# Patient Record
Sex: Female | Born: 1990
Health system: Southern US, Community
[De-identification: ages and names within clinical notes are randomized; demographics above are authoritative.]

## PROBLEM LIST (undated history)

## (undated) DIAGNOSIS — O09299 Supervision of pregnancy with other poor reproductive or obstetric history, unspecified trimester: Secondary | ICD-10-CM

## (undated) DIAGNOSIS — L309 Dermatitis, unspecified: Secondary | ICD-10-CM

## (undated) DIAGNOSIS — R519 Headache, unspecified: Secondary | ICD-10-CM

## (undated) DIAGNOSIS — J302 Other seasonal allergic rhinitis: Secondary | ICD-10-CM

## (undated) DIAGNOSIS — T783XXA Angioneurotic edema, initial encounter: Secondary | ICD-10-CM

## (undated) DIAGNOSIS — O1490 Unspecified pre-eclampsia, unspecified trimester: Secondary | ICD-10-CM

## (undated) DIAGNOSIS — J45909 Unspecified asthma, uncomplicated: Secondary | ICD-10-CM

## (undated) DIAGNOSIS — R51 Headache: Secondary | ICD-10-CM

## (undated) DIAGNOSIS — E785 Hyperlipidemia, unspecified: Secondary | ICD-10-CM

## (undated) HISTORY — DX: Supervision of pregnancy with other poor reproductive or obstetric history, unspecified trimester: O09.299

## (undated) HISTORY — DX: Unspecified pre-eclampsia, unspecified trimester: O14.90

## (undated) HISTORY — PX: WISDOM TOOTH EXTRACTION: SHX21

## (undated) HISTORY — DX: Angioneurotic edema, initial encounter: T78.3XXA

---
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
## 2011-01-21 LAB — BASIC METABOLIC PANEL
CO2: 28 mEq/L (ref 19–32)
Chloride: 107 mEq/L (ref 96–112)
Glucose, Bld: 98 mg/dL (ref 70–99)
Potassium: 3.8 mEq/L (ref 3.5–5.1)
Sodium: 139 mEq/L (ref 135–145)

## 2011-01-21 LAB — CBC
HCT: 39.8 % (ref 36.0–49.0)
Hemoglobin: 13.5 g/dL (ref 12.0–16.0)
MCHC: 33.9 g/dL (ref 31.0–37.0)
RBC: 4.46 MIL/uL (ref 3.80–5.70)
RDW: 13.3 % (ref 11.4–15.5)

## 2011-01-21 LAB — DIFFERENTIAL
Basophils Absolute: 0 10*3/uL (ref 0.0–0.1)
Basophils Relative: 1 % (ref 0–1)
Eosinophils Relative: 18 % — ABNORMAL HIGH (ref 0–5)
Monocytes Absolute: 0.4 10*3/uL (ref 0.2–1.2)
Monocytes Relative: 9 % (ref 3–11)

## 2012-09-09 ENCOUNTER — Encounter (HOSPITAL_COMMUNITY): Payer: Self-pay | Admitting: Emergency Medicine

## 2012-09-09 ENCOUNTER — Emergency Department (INDEPENDENT_AMBULATORY_CARE_PROVIDER_SITE_OTHER)
Admission: EM | Admit: 2012-09-09 | Discharge: 2012-09-09 | Disposition: A | Discharge status: Home / Self Care | Payer: BC Managed Care – PPO | Source: Home / Self Care | Attending: Emergency Medicine | Admitting: Emergency Medicine

## 2012-09-09 DIAGNOSIS — L0231 Cutaneous abscess of buttock: Secondary | ICD-10-CM

## 2012-09-09 HISTORY — DX: Dermatitis, unspecified: L30.9

## 2012-09-09 HISTORY — DX: Unspecified asthma, uncomplicated: J45.909

## 2012-09-09 MED ORDER — HYDROCODONE-ACETAMINOPHEN 5-325 MG PO TABS: 2.0000 | ORAL_TABLET | ORAL | Status: DC | PRN

## 2012-09-09 MED ORDER — SULFAMETHOXAZOLE-TRIMETHOPRIM 800-160 MG PO TABS: 1.0000 | ORAL_TABLET | Freq: Two times a day (BID) | ORAL | Status: DC

## 2012-09-09 NOTE — ED Provider Notes (Signed)
Medical screening examination/treatment/procedure(s) were performed by non-physician practitioner and as supervising physician I was immediately available for consultation/collaboration.  Leslee Home, M.D.   Reuben Likes, MD 09/09/12 2040

## 2012-09-09 NOTE — ED Notes (Signed)
Pt c/o boil on right buttock x several weeks. Area is swollen and painful with touch. Pt denies drainage.

## 2012-09-09 NOTE — ED Provider Notes (Signed)
History     CSN: [id number]  Arrival date & time 09/09/12  1044   First MD Initiated Contact with Patient 09/09/12 1137      Chief Complaint  Patient presents with  . Recurrent Skin Infections    right buttock x several weeks, swollen, painful with touch    (Consider location/radiation/quality/duration/timing/severity/associated sxs/prior treatment) Patient is a 21 y.o. female presenting with abscess. The history is provided by the patient. No language interpreter was used.  Abscess  This is a new problem. The current episode started more than one week ago. The onset was gradual. The problem occurs continuously. The problem has been gradually worsening. The abscess is present on the right buttock. The problem is severe. The abscess is characterized by itchiness, redness and painfulness. Associated with: nothing. There were no sick contacts.    Past Medical History  Diagnosis Date  . Asthma   . Eczema     Past Surgical History  Procedure Date  . Wisdom tooth extraction     History reviewed. No pertinent family history.  History  Substance Use Topics  . Smoking status: Never Smoker   . Smokeless tobacco: Not on file  . Alcohol Use: Yes     Comment: occasional    OB History    Grav Para Term Preterm Abortions TAB SAB Ect Mult Living                  Review of Systems  All other systems reviewed and are negative.    Allergies  Peanuts  Home Medications  No current outpatient prescriptions on file.  BP 119/81  Pulse 87  Temp 98.6 F (37 C) (Oral)  Resp 18  SpO2 97%  Physical Exam  Nursing note and vitals reviewed. Constitutional: She appears well-developed and well-nourished.  HENT:  Head: Normocephalic.  Eyes: Pupils are equal, round, and reactive to light.  Cardiovascular: Normal rate.   Pulmonary/Chest: Effort normal.  Abdominal: Soft.  Musculoskeletal: She exhibits edema and tenderness.       Right buttock,  Golf ball sized area of swelling    Neurological: She is alert.    ED Course  INCISION AND DRAINAGE Date/Time: 09/09/2012 12:35 PM Performed by: Elson Areas Authorized by: Leslee Home C Consent: Verbal consent not obtained. Risks and benefits: risks, benefits and alternatives were discussed Consent given by: patient Patient identity confirmed: verbally with patient Time out: Immediately prior to procedure a "time out" was called to verify the correct patient, procedure, equipment, support staff and site/side marked as required. Body area: lower extremity Location details: right buttock Anesthesia: local infiltration Local anesthetic: lidocaine 2% without epinephrine Patient sedated: no Scalpel size: 11 Incision type: elliptical Complexity: simple Drainage: purulent Drainage amount: copious Wound treatment: wound left open Packing material: 1/2 in gauze   (including critical care time)  Labs Reviewed - No data to display No results found.   No diagnosis found.    MDM  Bactrim ds  and hydrocodone       Lonia Skinner Onyx, [place] 09/09/12 1236

## 2012-09-11 ENCOUNTER — Emergency Department (INDEPENDENT_AMBULATORY_CARE_PROVIDER_SITE_OTHER)
Admission: EM | Admit: 2012-09-11 | Discharge: 2012-09-11 | Disposition: A | Discharge status: Home / Self Care | Payer: BC Managed Care – PPO | Source: Home / Self Care | Attending: Emergency Medicine | Admitting: Emergency Medicine

## 2012-09-11 ENCOUNTER — Encounter (HOSPITAL_COMMUNITY): Payer: Self-pay | Admitting: Emergency Medicine

## 2012-09-11 DIAGNOSIS — Z5189 Encounter for other specified aftercare: Secondary | ICD-10-CM

## 2012-09-11 NOTE — ED Notes (Signed)
Pt is here for a f/u of abscess on right gluteus... States that the packing fell out yesterday... Has noticed drainage and blood but the pain has improved... Denies: fevers, vomiting, nauseas, diarrhea... Pt is alert w/no signs of acute distress.

## 2012-09-11 NOTE — ED Notes (Signed)
Final report pending; preliminary report shows staph aureus

## 2012-09-11 NOTE — ED Provider Notes (Signed)
History     CSN: [id number]  Arrival date & time 09/11/12  1005   First MD Initiated Contact with Patient 09/11/12 1227      Chief Complaint  Patient presents with  . Follow-up    (Consider location/radiation/quality/duration/timing/severity/associated sxs/prior treatment) Patient is a 21 y.o. female presenting with wound check. The history is provided by the patient.  Wound Check  She was treated in the ED 2 to 3 days ago. Previous treatment in the ED includes I&D of abscess. Treatments since wound repair include oral antibiotics. The fever has been present for less than 1 day. Her temperature was unmeasured prior to arrival. There has been no drainage from the wound. The redness has improved. The swelling has improved. The pain has improved. She has no difficulty moving the affected extremity or digit.    Past Medical History  Diagnosis Date  . Asthma   . Eczema     Past Surgical History  Procedure Date  . Wisdom tooth extraction     No family history on file.  History  Substance Use Topics  . Smoking status: Never Smoker   . Smokeless tobacco: Not on file  . Alcohol Use: Yes     Comment: occasional    OB History    Grav Para Term Preterm Abortions TAB SAB Ect Mult Living                  Review of Systems  Skin: Positive for wound.  All other systems reviewed and are negative.    Allergies  Peanuts  Home Medications   Current Outpatient Rx  Name  Route  Sig  Dispense  Refill  . SULFAMETHOXAZOLE-TRIMETHOPRIM 800-160 MG PO TABS   Oral   Take 1 tablet by mouth every 12 (twelve) hours.   14 tablet   0   . HYDROCODONE-ACETAMINOPHEN 5-325 MG PO TABS   Oral   Take 2 tablets by mouth every 4 (four) hours as needed for pain.   16 tablet   0     BP 113/79  Pulse 72  Temp 98.1 F (36.7 C) (Oral)  Resp 20  SpO2 99%  Physical Exam  Nursing note and vitals reviewed. Constitutional: She is oriented to person, place, and time. Vital signs are  normal. She appears well-developed and well-nourished. She is active and cooperative.  HENT:  Head: Normocephalic.  Eyes: Conjunctivae normal are normal. Pupils are equal, round, and reactive to light. No scleral icterus.  Neck: Trachea normal and normal range of motion. Neck supple.  Cardiovascular: Normal rate, regular rhythm, normal heart sounds and intact distal pulses.   Pulmonary/Chest: Effort normal and breath sounds normal.  Neurological: She is alert and oriented to person, place, and time. No cranial nerve deficit or sensory deficit.  Skin: Skin is warm and dry.     Psychiatric: She has a normal mood and affect. Her speech is normal and behavior is normal. Judgment and thought content normal. Cognition and memory are normal.    ED Course  Procedures (including critical care time)  Labs Reviewed - No data to display No results found.   1. Wound check, abscess       MDM  Continue antibiotics as prescribed.  RTC PRN       Johnsie Kindred, NP 09/11/12 1232

## 2012-09-12 LAB — CULTURE, ROUTINE-ABSCESS

## 2012-09-12 NOTE — ED Notes (Signed)
Abscess culture R buttocks: mod. Staph. Aureus.  Pt. adequately treated with Septra DS. Vassie Moselle 09/12/2012

## 2012-09-12 NOTE — ED Provider Notes (Signed)
Medical screening examination/treatment/procedure(s) were performed by non-physician practitioner and as supervising physician I was immediately available for consultation/collaboration.  Leslee Home, M.D.   Reuben Likes, MD 09/12/12 [phone]

## 2014-08-22 ENCOUNTER — Emergency Department (HOSPITAL_BASED_OUTPATIENT_CLINIC_OR_DEPARTMENT_OTHER)
Admission: EM | Admit: 2014-08-22 | Discharge: 2014-08-22 | Disposition: A | Discharge status: Home / Self Care | Payer: BC Managed Care – PPO | Attending: Emergency Medicine | Admitting: Emergency Medicine

## 2014-08-22 ENCOUNTER — Encounter (HOSPITAL_BASED_OUTPATIENT_CLINIC_OR_DEPARTMENT_OTHER): Payer: Self-pay | Admitting: *Deleted

## 2014-08-22 DIAGNOSIS — I159 Secondary hypertension, unspecified: Secondary | ICD-10-CM | POA: Diagnosis not present

## 2014-08-22 DIAGNOSIS — G43909 Migraine, unspecified, not intractable, without status migrainosus: Secondary | ICD-10-CM | POA: Diagnosis not present

## 2014-08-22 DIAGNOSIS — J45909 Unspecified asthma, uncomplicated: Secondary | ICD-10-CM | POA: Insufficient documentation

## 2014-08-22 DIAGNOSIS — R51 Headache: Secondary | ICD-10-CM | POA: Diagnosis present

## 2014-08-22 DIAGNOSIS — Z872 Personal history of diseases of the skin and subcutaneous tissue: Secondary | ICD-10-CM | POA: Diagnosis not present

## 2014-08-22 HISTORY — DX: Headache: R51

## 2014-08-22 HISTORY — DX: Headache, unspecified: R51.9

## 2014-08-22 NOTE — Discharge Instructions (Signed)

## 2014-08-22 NOTE — ED Notes (Signed)
Pt c/o h/a x 1 day. Imetrex taken at 1530 no rielf

## 2014-08-22 NOTE — ED Provider Notes (Signed)
CSN: [id number]     Arrival date & time 08/22/14  1842 History   None    This chart was scribed for Linwood DibblesJon Chasity Outten, MD by Arlan OrganAshley Leger, ED Scribe. This patient was seen in room MH08/MH08 and the patient's care was started 9:34 PM.   Chief Complaint  Patient presents with  . Headache    Patient is a 23 y.o. female presenting with headaches. The history is provided by the patient. No language interpreter was used.  Headache Pain location:  Generalized Radiates to:  Does not radiate Onset quality:  Gradual Duration:  1 day Timing:  Constant Progression:  Resolved Chronicity:  Chronic Similar to prior headaches: yes   Relieved by:  None tried Worsened by:  Nothing tried Ineffective treatments:  Prescription medications Associated symptoms: nausea   Associated symptoms: no fever     HPI Comments: Jane Ellison is a 23 y.o. female with a PMHx of migraines who presents to the Emergency Department complaining of a constant, moderate HA x 1 month that has progressively worsened in last 24 hours. She also reports nausea associated with her HA. However, pt states symptoms have resolved while waiting in ED.  Pt has tried prescribed Imetrex with mild improvement; last dose at 15:30 this evening. She denies any fever, visual changes, or chills. Jane Ellison is followed by her PCP for history of Migraines. No known allergies to medications. No other concerns this visit.  Past Medical History  Diagnosis Date  . Asthma   . Eczema   . Head ache    Past Surgical History  Procedure Laterality Date  . Wisdom tooth extraction     History reviewed. No pertinent family history. History  Substance Use Topics  . Smoking status: Never Smoker   . Smokeless tobacco: Not on file  . Alcohol Use: Yes     Comment: occasional   OB History    No data available     Review of Systems  Constitutional: Negative for fever and chills.  Eyes: Negative for visual disturbance.  Gastrointestinal: Positive for  nausea.  Neurological: Positive for headaches.  All other systems reviewed and are negative.     Allergies  Peanuts and Shellfish allergy  Home Medications   Prior to Admission medications   Not on File   Triage Vitals: BP 152/95 mmHg  Pulse 85  Temp(Src) 98.6 F (37 C)  Resp 16  Ht 5\' 2"  (1.575 m)  Wt 208 lb (94.348 kg)  BMI 38.03 kg/m2  SpO2 99%  LMP 08/20/2014   Physical Exam  Constitutional: She appears well-developed and well-nourished. No distress.  HENT:  Head: Normocephalic and atraumatic.  Right Ear: External ear normal.  Left Ear: External ear normal.  Eyes: Conjunctivae are normal. Right eye exhibits no discharge. Left eye exhibits no discharge. No scleral icterus.  Neck: Neck supple. No tracheal deviation present.  Cardiovascular: Normal rate, regular rhythm and intact distal pulses.   Pulmonary/Chest: Effort normal and breath sounds normal. No stridor. No respiratory distress. She has no wheezes. She has no rales.  Abdominal: Soft. Bowel sounds are normal. She exhibits no distension. There is no tenderness. There is no rebound and no guarding.  Musculoskeletal: She exhibits no edema or tenderness.  Neurological: She is alert. She has normal strength. No cranial nerve deficit (no facial droop, extraocular movements intact, no slurred speech) or sensory deficit. She exhibits normal muscle tone. She displays no seizure activity. Coordination normal.  Skin: Skin is warm and dry. No rash  noted.  Psychiatric: She has a normal mood and affect.  Nursing note and vitals reviewed.   ED Course  Procedures (including critical care time)  DIAGNOSTIC STUDIES: Oxygen Saturation is 99% on RA, Normal by my interpretation.    COORDINATION OF CARE: 9:34 PM-Discussed treatment plan with pt at bedside and pt agreed to plan.       MDM   Final diagnoses:  Migraine without status migrainosus, not intractable, unspecified migraine type  Secondary hypertension,  unspecified    Patient's headache resolved before my evaluation.her symptoms are consistent with migraine headache. The patient's blood pressure is elevated. Recommend follow-up with primary care doctor to have that rechecked. He may be related to the headache she was experiencing this evening however there is a family history of hypertension. The patient was asking about taking medications to prevent the headaches. She has been having daily headaches. I suggested following up with her primary doctor. She may be  a candidate for something like Lopressor  I personally performed the services described in this documentation, which was scribed in my presence. The recorded information has been reviewed and is accurate.    Linwood DibblesJon Chandon Lazcano, MD 08/22/14 2136

## 2014-08-22 NOTE — ED Notes (Addendum)
Pt c/o ha x 1 day took imetrex  States ha went away about 5 pm,    States has had ha off and on x 1 month,  States has talked to her md  If she cont to have ha she will have to see him

## 2015-03-14 ENCOUNTER — Other Ambulatory Visit: Payer: Self-pay | Admitting: Family Medicine

## 2015-03-14 ENCOUNTER — Other Ambulatory Visit (HOSPITAL_COMMUNITY)
Admission: RE | Admit: 2015-03-14 | Discharge: 2015-03-14 | Disposition: A | Discharge status: Home / Self Care | Payer: BC Managed Care – PPO | Source: Ambulatory Visit | Attending: Family Medicine | Admitting: Family Medicine

## 2015-03-14 DIAGNOSIS — Z113 Encounter for screening for infections with a predominantly sexual mode of transmission: Secondary | ICD-10-CM | POA: Insufficient documentation

## 2015-03-14 DIAGNOSIS — Z01419 Encounter for gynecological examination (general) (routine) without abnormal findings: Secondary | ICD-10-CM | POA: Diagnosis present

## 2015-03-17 LAB — CYTOLOGY - PAP

## 2015-08-26 LAB — GLUCOSE, POCT (MANUAL RESULT ENTRY): POC Glucose: 162 mg/dl — AB (ref 70–99)

## 2017-05-20 ENCOUNTER — Other Ambulatory Visit (HOSPITAL_COMMUNITY)
Admission: RE | Admit: 2017-05-20 | Discharge: 2017-05-20 | Disposition: A | Discharge status: Home / Self Care | Payer: BLUE CROSS/BLUE SHIELD | Source: Ambulatory Visit | Attending: Family Medicine | Admitting: Family Medicine

## 2017-05-20 ENCOUNTER — Other Ambulatory Visit: Payer: Self-pay | Admitting: Family Medicine

## 2017-05-20 DIAGNOSIS — Z124 Encounter for screening for malignant neoplasm of cervix: Secondary | ICD-10-CM | POA: Diagnosis present

## 2017-05-24 LAB — CYTOLOGY - PAP: DIAGNOSIS: NEGATIVE

## 2017-09-09 ENCOUNTER — Encounter: Payer: BLUE CROSS/BLUE SHIELD | Admitting: Obstetrics and Gynecology

## 2018-02-09 ENCOUNTER — Other Ambulatory Visit: Payer: Self-pay

## 2018-02-09 ENCOUNTER — Encounter (HOSPITAL_COMMUNITY): Payer: Self-pay | Admitting: *Deleted

## 2018-02-17 ENCOUNTER — Ambulatory Visit (HOSPITAL_COMMUNITY)
Admission: RE | Admit: 2018-02-17 | Payer: BLUE CROSS/BLUE SHIELD | Source: Ambulatory Visit | Admitting: Obstetrics & Gynecology

## 2018-02-17 HISTORY — DX: Other seasonal allergic rhinitis: J30.2

## 2018-02-17 HISTORY — DX: Hyperlipidemia, unspecified: E78.5

## 2018-02-17 SURGERY — DILATION AND EVACUATION, UTERUS
Anesthesia: Choice

## 2018-05-17 ENCOUNTER — Other Ambulatory Visit (HOSPITAL_COMMUNITY): Payer: Self-pay | Admitting: Obstetrics & Gynecology

## 2018-05-17 DIAGNOSIS — N979 Female infertility, unspecified: Secondary | ICD-10-CM

## 2018-05-22 ENCOUNTER — Encounter (HOSPITAL_COMMUNITY): Payer: Self-pay | Admitting: Radiology

## 2018-05-22 ENCOUNTER — Ambulatory Visit (HOSPITAL_COMMUNITY)
Admission: RE | Admit: 2018-05-22 | Discharge: 2018-05-22 | Disposition: A | Discharge status: Home / Self Care | Payer: BLUE CROSS/BLUE SHIELD | Source: Ambulatory Visit | Attending: Obstetrics & Gynecology | Admitting: Obstetrics & Gynecology

## 2018-05-22 DIAGNOSIS — N7011 Chronic salpingitis: Secondary | ICD-10-CM | POA: Insufficient documentation

## 2018-05-22 DIAGNOSIS — N971 Female infertility of tubal origin: Secondary | ICD-10-CM | POA: Insufficient documentation

## 2018-05-22 DIAGNOSIS — N979 Female infertility, unspecified: Secondary | ICD-10-CM

## 2018-05-22 MED ORDER — IOPAMIDOL (ISOVUE-300) INJECTION 61%
30.0000 mL | Freq: Once | INTRAVENOUS | Status: AC | PRN
  Administered 2018-05-22: 8 mL via INTRAVENOUS

## 2018-10-11 NOTE — L&D Delivery Note (Addendum)
Delivery Note Called to attend a precipitous delivery.  On my arrival baby was crowning with RN attending  At 10:28 PM a viable and healthy female was delivered via Vaginal, Spontaneous (Presentation: ROA).  APGAR: 9, 9; weight  .   Placenta status:Spontaneous and grossly intact with 3 vessel Cord:  with the following complications: .Borderline short cord, around 14 in  Anesthesia:  Local for repair Episiotomy: None Lacerations: 1st degree;Periurethral;Perineal Suture Repair: 3.0 vicryl  Dr Landry Mellow arrived and did repair  Est. Blood Loss (mL):  Not measured yet.   Mom to postpartum.  Baby to Couplet care / Skin to Skin.  Hansel Feinstein 05/26/2019, 10:55 PM  Agree with above: Precipitous delivery. Upon my arrival newborn and placenta had been delivered. I repaired a first degree  Perineal laceration with 3-0 vicryl. Uterus was firm.

## 2018-10-18 LAB — OB RESULTS CONSOLE RUBELLA ANTIBODY, IGM: Rubella: IMMUNE

## 2018-10-18 LAB — OB RESULTS CONSOLE HIV ANTIBODY (ROUTINE TESTING): HIV: NONREACTIVE

## 2018-10-18 LAB — OB RESULTS CONSOLE VARICELLA ZOSTER ANTIBODY, IGG: Varicella: IMMUNE

## 2018-11-16 LAB — OB RESULTS CONSOLE GC/CHLAMYDIA
Chlamydia: NEGATIVE
Gonorrhea: NEGATIVE

## 2018-12-14 ENCOUNTER — Encounter: Payer: Self-pay | Admitting: Allergy

## 2018-12-14 ENCOUNTER — Ambulatory Visit (INDEPENDENT_AMBULATORY_CARE_PROVIDER_SITE_OTHER): Payer: BLUE CROSS/BLUE SHIELD | Admitting: Allergy

## 2018-12-14 ENCOUNTER — Other Ambulatory Visit: Payer: Self-pay

## 2018-12-14 VITALS — BP 120/72 | HR 95 | Temp 98.7°F | Resp 18 | Ht 62.0 in | Wt 212.0 lb

## 2018-12-14 DIAGNOSIS — J4520 Mild intermittent asthma, uncomplicated: Secondary | ICD-10-CM | POA: Insufficient documentation

## 2018-12-14 DIAGNOSIS — T781XXD Other adverse food reactions, not elsewhere classified, subsequent encounter: Secondary | ICD-10-CM | POA: Diagnosis not present

## 2018-12-14 DIAGNOSIS — J302 Other seasonal allergic rhinitis: Secondary | ICD-10-CM | POA: Diagnosis not present

## 2018-12-14 DIAGNOSIS — Z3492 Encounter for supervision of normal pregnancy, unspecified, second trimester: Secondary | ICD-10-CM | POA: Insufficient documentation

## 2018-12-14 DIAGNOSIS — T781XXA Other adverse food reactions, not elsewhere classified, initial encounter: Secondary | ICD-10-CM | POA: Insufficient documentation

## 2018-12-14 DIAGNOSIS — J3089 Other allergic rhinitis: Secondary | ICD-10-CM

## 2018-12-14 DIAGNOSIS — H1013 Acute atopic conjunctivitis, bilateral: Secondary | ICD-10-CM | POA: Diagnosis not present

## 2018-12-14 DIAGNOSIS — Z3A15 15 weeks gestation of pregnancy: Secondary | ICD-10-CM | POA: Insufficient documentation

## 2018-12-14 MED ORDER — AUVI-Q 0.3 MG/0.3ML IJ SOAJ: 0.3000 mg | INTRAMUSCULAR | 1 refills | Status: DC | PRN

## 2018-12-14 MED ORDER — ALBUTEROL SULFATE HFA 108 (90 BASE) MCG/ACT IN AERS: 2.0000 | INHALATION_SPRAY | RESPIRATORY_TRACT | 1 refills | Status: DC | PRN

## 2018-12-14 NOTE — Patient Instructions (Addendum)
Today's spirometry was normal.   Asthma: . Daily controller medication(s): NONE . Prior to physical activity: May use albuterol rescue inhaler 2 puffs 5 to 15 minutes prior to strenuous physical activities. Marland Kitchen Rescue medications: May use albuterol rescue inhaler 2 puffs or nebulizer every 4 to 6 hours as needed for shortness of breath, chest tightness, coughing, and wheezing. Monitor frequency of use.  . Let me know if you are using your rescue inhaler more than 1-2 times a week.  . Asthma control goals:  o Full participation in all desired activities (may need albuterol before activity) o Albuterol use two times or less a week on average (not counting use with activity) o Cough interfering with sleep two times or less a month o Oral steroids no more than once a year o No hospitalizations  Allergic rhinitis:  2013 skin testing showed: positive to grass, ragweed, weed, trees, mold, dust mites, cat, dog, horse, cockroach.  Continue environmental control measures.   May use Flonase 1-2 sprays daily.  Continue zyrtec 10mg  daily.  Food allergy:  Continue strict avoidance of tree nuts and shellfish  I have prescribed epinephrine injectable and demonstrated proper use. For mild symptoms you can take over the counter antihistamines such as Benadryl and monitor symptoms closely. If symptoms worsen or if you have severe symptoms including breathing issues, throat closure, significant swelling, whole body hives, severe diarrhea and vomiting, lightheadedness then inject epinephrine and seek immediate medical care afterwards.  Food action plan given.  Follow up in 3 months.  Reducing Pollen Exposure . Pollen seasons: trees (spring), grass (summer) and ragweed/weeds (fall). Marland Kitchen Keep windows closed in your home and car to lower pollen exposure.  Lilian Kapur air conditioning in the bedroom and throughout the house if possible.  . Avoid going out in dry windy days - especially early  morning. . Pollen counts are highest between 5 - 10 AM and on dry, hot and windy days.  . Save outside activities for late afternoon or after a heavy rain, when pollen levels are lower.  . Avoid mowing of grass if you have grass pollen allergy. Marland Kitchen Be aware that pollen can also be transported indoors on people and pets.  . Dry your clothes in an automatic dryer rather than hanging them outside where they might collect pollen.  . Rinse hair and eyes before bedtime.  Mold Control . Mold and fungi can grow on a variety of surfaces provided certain temperature and moisture conditions exist.  . Outdoor molds grow on plants, decaying vegetation and soil. The major outdoor mold, Alternaria and Cladosporium, are found in very high numbers during hot and dry conditions. Generally, a late summer - fall peak is seen for common outdoor fungal spores. Rain will temporarily lower outdoor mold spore count, but counts rise rapidly when the rainy period ends. . The most important indoor molds are Aspergillus and Penicillium. Dark, humid and poorly ventilated basements are ideal sites for mold growth. The next most common sites of mold growth are the bathroom and the kitchen. Outdoor (Seasonal) Mold Control . Use air conditioning and keep windows closed. . Avoid exposure to decaying vegetation. Marland Kitchen Avoid leaf raking. . Avoid grain handling. . Consider wearing a face mask if working in moldy areas.  Indoor (Perennial) Mold Control  . Maintain humidity below 50%. . Get rid of mold growth on hard surfaces with water, detergent and, if necessary, 5% bleach (do not mix with other cleaners). Then dry the area completely. If mold  covers an area more than 10 square feet, consider hiring an indoor environmental professional. . For clothing, washing with soap and water is best. If moldy items cannot be cleaned and dried, throw them away. . Remove sources e.g. contaminated carpets. . Repair and seal leaking roofs or pipes.  Using dehumidifiers in damp basements may be helpful, but empty the water and clean units regularly to prevent mildew from forming. All rooms, especially basements, bathrooms and kitchens, require ventilation and cleaning to deter mold and mildew growth. Avoid carpeting on concrete or damp floors, and storing items in damp areas. Control of House Dust Mite Allergen . Dust mite allergens are a common trigger of allergy and asthma symptoms. While they can be found throughout the house, these microscopic creatures thrive in warm, humid environments such as bedding, upholstered furniture and carpeting. . Because so much time is spent in the bedroom, it is essential to reduce mite levels there.  . Encase pillows, mattresses, and box springs in special allergen-proof fabric covers or airtight, zippered plastic covers.  . Bedding should be washed weekly in hot water (130 F) and dried in a hot dryer. Allergen-proof covers are available for comforters and pillows that can't be regularly washed.  Reyes Ivan the allergy-proof covers every few months. Minimize clutter in the bedroom. Keep pets out of the bedroom.  Marland Kitchen Keep humidity less than 50% by using a dehumidifier or air conditioning. You can buy a humidity measuring device called a hygrometer to monitor this.  . If possible, replace carpets with hardwood, linoleum, or washable area rugs. If that's not possible, vacuum frequently with a vacuum that has a HEPA filter. . Remove all upholstered furniture and non-washable window drapes from the bedroom. . Remove all non-washable stuffed toys from the bedroom.  Wash stuffed toys weekly. Pet Allergen Avoidance: . Contrary to popular opinion, there are no "hypoallergenic" breeds of dogs or cats. That is because people are not allergic to an animal's hair, but to an allergen found in the animal's saliva, dander (dead skin flakes) or urine. Pet allergy symptoms typically occur within minutes. For some people, symptoms can  build up and become most severe 8 to 12 hours after contact with the animal. People with severe allergies can experience reactions in public places if dander has been transported on the pet owners' clothing. Marland Kitchen Keeping an animal outdoors is only a partial solution, since homes with pets in the yard still have higher concentrations of animal allergens. . Before getting a pet, ask your allergist to determine if you are allergic to animals. If your pet is already considered part of your family, try to minimize contact and keep the pet out of the bedroom and other rooms where you spend a great deal of time. . As with dust mites, vacuum carpets often or replace carpet with a hardwood floor, tile or linoleum. . High-efficiency particulate air (HEPA) cleaners can reduce allergen levels over time. . While dander and saliva are the source of cat and dog allergens, urine is the source of allergens from rabbits, hamsters, mice and [place] pigs; so ask a non-allergic family member to clean the animal's cage. . If you have a pet allergy, talk to your allergist about the potential for allergy immunotherapy (allergy shots). This strategy can often provide long-term relief. Cockroach Allergen Avoidance Cockroaches are often found in the homes of densely populated urban areas, schools or commercial buildings, but these creatures can lurk almost anywhere. This does not mean that you have  a dirty house or living area. . Block all areas where roaches can enter the home. This includes crevices, wall cracks and windows.  . Cockroaches need water to survive, so fix and seal all leaky faucets and pipes. Have an exterminator go through the house when your family and pets are gone to eliminate any remaining roaches. Marland Kitchen Keep food in lidded containers and put pet food dishes away after your pets are done eating. Vacuum and sweep the floor after meals, and take out garbage and recyclables. Use lidded garbage containers in the kitchen.  Wash dishes immediately after use and clean under stoves, refrigerators or toasters where crumbs can accumulate. Wipe off the stove and other kitchen surfaces and cupboards regularly.

## 2018-12-14 NOTE — Assessment & Plan Note (Addendum)
Reaction to shrimp and possibly tree nuts in the past in the form of throat swelling, facial pruritus and tongue angioedema. 2013 skin testing was positive to cashew, shrimp, corn, walnut, almond, hazelnut and borderline to pineapple.  Currently avoiding seafood, shellfish and tree nuts.  Tolerates peanuts without any problems.  Continue strict avoidance of tree nuts and shellfish.  I have prescribed epinephrine injectable and demonstrated proper use. For mild symptoms you can take over the counter antihistamines such as Benadryl and monitor symptoms closely. If symptoms worsen or if you have severe symptoms including breathing issues, throat closure, significant swelling, whole body hives, severe diarrhea and vomiting, lightheadedness then inject epinephrine and seek immediate medical care afterwards.  Food action plan given.  Recommend retesting after pregnancy.

## 2018-12-14 NOTE — Assessment & Plan Note (Addendum)
Diagnosed with asthma over 20 years ago.  Main triggers include exertion, infection and allergies.  Using albuterol less than once a week with good benefit.  Currently [redacted] weeks pregnant and worried about worsening asthma during her first pregnancy.  Today's spirometry was normal. . Daily controller medication(s): NONE . Prior to physical activity: May use albuterol rescue inhaler 2 puffs 5 to 15 minutes prior to strenuous physical activities. Marland Kitchen Rescue medications: May use albuterol rescue inhaler 2 puffs or nebulizer every 4 to 6 hours as needed for shortness of breath, chest tightness, coughing, and wheezing. Monitor frequency of use.  . Let me know if you are using your rescue inhaler more than 1-2 times a week.

## 2018-12-14 NOTE — Assessment & Plan Note (Signed)
Perennial rhinoconjunctivitis symptoms for the past 20+ years with worsening in the spring.  Currently using Zyrtec and Flonase as needed with good benefit. 2013 skin testing was positive to grass, ragweed, weed, tree, mold, dust mite, cat, dog, horse, cockroach.  Patient was on allergy immunotherapy for a while with some benefit.  Continue environmental control measures.   May use Flonase 1-2 sprays daily.  Continue zyrtec 10mg  daily.

## 2018-12-14 NOTE — Progress Notes (Signed)
New Patient Note  RE: Jane Ellison MRN: [id number] DOB: Sep 24, 1991 Date of Office Visit: 12/14/2018  Referring provider: Wilfrid Lund, PA Primary care provider: Wilfrid Lund, PA  Chief Complaint: Asthma (pregnant and her PCP & GYN wanted her to be seen by her asthma provider (seen by Dr. Willa Rough 3-4 yrs ago) to get evaluated and to help avoid any exacerbations. )  History of Present Illness: I had the pleasure of seeing Jane Ellison for initial evaluation at the [facility] on 12/14/2018. She is a 28 y.o. female, who is referred here by Wilfrid Lund, PA for the evaluation of asthma during pregnancy. Patient was seen by Dr. Willa Rough in the past for asthma and allergies.   Asthma: She reports symptoms of chest tightness, shortness of breath for 20+ years. Current medications include albuterol prn which help. She reports not using aerochamber with asthma inhalers. She tried the following inhalers: none. Main asthma triggers are exertion, infections, allergies. In the last month, frequency of asthma symptoms: <1x/week. Frequency of nocturnal symptoms: 0x/month. Frequency of SABA use: <1x/week. Interference with physical activity: rarely. Sleep is undisturbed. In the last 12 months, emergency room visits/urgent care visits/doctor office visits or hospitalizations due to asthma: no. In the last 12 months, oral steroids courses: no. Lifetime history of hospitalization for asthma: no. Prior intubations: no. History of pneumonia: once in first grade. She was evaluated by allergist in the past. Smoking exposure: quit 3 years ago. Up to date with flu vaccine: yes.   Patient is currently [redacted] week pregnant and this is her first pregnancy. She had a miscarriage previously around 6-9 weeks.  Assessment and Plan: Ali is a 28 y.o. female with: Mild intermittent asthma without complication Diagnosed with asthma over 20 years ago.  Main triggers include exertion, infection and  allergies.  Using albuterol less than once a week with good benefit.  Currently [redacted] weeks pregnant and worried about worsening asthma during her first pregnancy.  Today's spirometry was normal. . Daily controller medication(s): NONE . Prior to physical activity: May use albuterol rescue inhaler 2 puffs 5 to 15 minutes prior to strenuous physical activities. Marland Kitchen Rescue medications: May use albuterol rescue inhaler 2 puffs or nebulizer every 4 to 6 hours as needed for shortness of breath, chest tightness, coughing, and wheezing. Monitor frequency of use.  . Let me know if you are using your rescue inhaler more than 1-2 times a week.  Seasonal and perennial allergic rhinoconjunctivitis of both eyes Perennial rhinoconjunctivitis symptoms for the past 20+ years with worsening in the spring.  Currently using Zyrtec and Flonase as needed with good benefit. 2013 skin testing was positive to grass, ragweed, weed, tree, mold, dust mite, cat, dog, horse, cockroach.  Patient was on allergy immunotherapy for a while with some benefit.  Continue environmental control measures.   May use Flonase 1-2 sprays daily.  Continue zyrtec 10mg  daily.  Adverse food reaction Reaction to shrimp and possibly tree nuts in the past in the form of throat swelling, facial pruritus and tongue angioedema. 2013 skin testing was positive to cashew, shrimp, corn, walnut, almond, hazelnut and borderline to pineapple.  Currently avoiding seafood, shellfish and tree nuts.  Tolerates peanuts without any problems.  Continue strict avoidance of tree nuts and shellfish.  I have prescribed epinephrine injectable and demonstrated proper use. For mild symptoms you can take over the counter antihistamines such as Benadryl and monitor symptoms closely. If symptoms worsen or if you  have severe symptoms including breathing issues, throat closure, significant swelling, whole body hives, severe diarrhea and vomiting, lightheadedness then inject  epinephrine and seek immediate medical care afterwards.  Food action plan given.  Recommend retesting after pregnancy.  Return in about 3 months (around 03/16/2019).  Meds ordered this encounter  Medications  . albuterol (PROVENTIL HFA;VENTOLIN HFA) 108 (90 Base) MCG/ACT inhaler    Sig: Inhale 2 puffs into the lungs every 4 (four) hours as needed for wheezing or shortness of breath.    Dispense:  18 g    Refill:  1  . AUVI-Q 0.3 MG/0.3ML SOAJ injection    Sig: Inject 0.3 mLs (0.3 mg total) into the muscle as needed for anaphylaxis.    Dispense:  2 Device    Refill:  1   Other allergy screening: Asthma: yes Rhino conjunctivitis: yes  She reports symptoms of rhinorrhea, sneezing, itchy eyes. Symptoms have been going on for 20+ years. The symptoms are present all year around with worsening in spring. She has used zyrtec daily, Flonase 1 spray daily with fair improvement in symptoms. Sinus infections: no. Previous work up includes: 2013 skin testing was positive to grass, ragweed, weed, tree, mold, dust mite, cat, dog, horse, cockroach.  She was on allergy immunotherapy for a while with some benefit.  Food allergy: yes  Currently avoiding shellfish and tree nuts. Shellfish: The reaction occurred at the age of 79, after she ate shrimp. Symptoms started within minutes and was in the form of throat swelling, facial pruritus, tongue swelling. Denies any diarrhea, vomiting. Denies any associated cofactors such as exertion, infection, NSAID use. The symptoms lasted for unknown amount of time and did not take any medications for this.  Tree nuts: Patient is unsure when this reaction occurred but she had popcorn with some type of nuts. Symptoms started within minutes and was in the form of throat discomfort. Unsure of how long the symptoms lasted for.   Since this episode, she does report other accidental exposures to shellfish, tree nuts. She does not have access to epinephrine  autoinjector.  Past work up includes: 2013 skin testing was positive to cashew, shrimp, corn, walnut, almond, hazelnut and borderline to pineapple. Dietary History: patient has been eating other foods including milk, eggs, peanut, sesame, soy, wheat, meats, fruits and vegetables.  She reports reading labels and avoiding seafood, shellfish, tree nuts in diet completely.   Medication allergy: no Hymenoptera allergy: no Urticaria: no Eczema: yes - well controlled.  History of recurrent infections suggestive of immunodeficency: no  Diagnostics: Spirometry:  Tracings reviewed. Her effort: Good reproducible efforts. FVC: 2.79L FEV1: 2.39L, 92% predicted FEV1/FVC ratio: 86% Interpretation: Spirometry consistent with normal pattern.  Please see scanned spirometry results for details.  Past Medical History: Patient Active Problem List   Diagnosis Date Noted  . Mild intermittent asthma without complication 12/14/2018  . [redacted] weeks gestation of pregnancy 12/14/2018  . Seasonal and perennial allergic rhinoconjunctivitis of both eyes 12/14/2018  . Adverse food reaction 12/14/2018  . Pregnant and not yet delivered in second trimester 12/14/2018   Past Medical History:  Diagnosis Date  . Angio-edema   . Asthma    rarely uses inhaler  . Eczema   . Head ache   . Hyperlipidemia    no meds, diet controlled  . Seasonal allergies    Past Surgical History: Past Surgical History:  Procedure Laterality Date  . WISDOM TOOTH EXTRACTION     Medication List:  Current Outpatient Medications  Medication  Sig Dispense Refill  . albuterol (PROVENTIL HFA;VENTOLIN HFA) 108 (90 Base) MCG/ACT inhaler Inhale 2 puffs into the lungs every 4 (four) hours as needed for wheezing or shortness of breath. 18 g 1  . cetirizine (ZYRTEC) 10 MG tablet Take 10 mg by mouth daily.    . fluticasone (FLONASE) 50 MCG/ACT nasal spray Place 1 spray into both nostrils daily.    Marland Kitchen ibuprofen (ADVIL,MOTRIN) 600 MG tablet Take  600 mg by mouth every 6 (six) hours as needed for moderate pain.    Marland Kitchen triamcinolone cream (KENALOG) 0.1 % Apply 1 application topically daily as needed (Dry skin).   6  . AUVI-Q 0.3 MG/0.3ML SOAJ injection Inject 0.3 mLs (0.3 mg total) into the muscle as needed for anaphylaxis. 2 Device 1   No current facility-administered medications for this visit.    Allergies: Allergies  Allergen Reactions  . Other Anaphylaxis    Tree Nuts Only  . Shellfish Allergy Anaphylaxis  . Iodinated Diagnostic Agents    Social History: Social History   Socioeconomic History  . Marital status: Single    Spouse name: Not on file  . Number of children: Not on file  . Years of education: Not on file  . Highest education level: Not on file  Occupational History  . Not on file  Social Needs  . Financial resource strain: Not on file  . Food insecurity:    Worry: Not on file    Inability: Not on file  . Transportation needs:    Medical: Not on file    Non-medical: Not on file  Tobacco Use  . Smoking status: Never Smoker  . Smokeless tobacco: Never Used  Substance and Sexual Activity  . Alcohol use: Not Currently    Comment: occasional  . Drug use: Never  . Sexual activity: Yes    Birth control/protection: None    Comment: pregnant  Lifestyle  . Physical activity:    Days per week: Not on file    Minutes per session: Not on file  . Stress: Not on file  Relationships  . Social connections:    Talks on phone: Not on file    Gets together: Not on file    Attends religious service: Not on file    Active member of club or organization: Not on file    Attends meetings of clubs or organizations: Not on file    Relationship status: Not on file  Other Topics Concern  . Not on file  Social History Narrative  . Not on file   Lives in an apartment. Smoking: denies Occupation: Adult nurse HistorySurveyor, minerals in the house: no Carpet in the family room: yes Carpet in the  bedroom: yes Heating: electric Cooling: central Pet: no  Family History: Family History  Problem Relation Age of Onset  . Hypertension Mother   . Allergy (severe) Mother        Grass  . Allergic rhinitis Neg Hx   . Angioedema Neg Hx   . Asthma Neg Hx   . Atopy Neg Hx   . Eczema Neg Hx   . Immunodeficiency Neg Hx   . Urticaria Neg Hx    Review of Systems  Constitutional: Negative for appetite change, chills, fever and unexpected weight change.  HENT: Negative for congestion and rhinorrhea.   Eyes: Negative for itching.  Respiratory: Negative for cough, chest tightness, shortness of breath and wheezing.   Cardiovascular: Negative for chest pain.  Gastrointestinal: Negative for abdominal  pain.  Genitourinary: Negative for difficulty urinating.  Skin: Negative for rash.  Allergic/Immunologic: Positive for environmental allergies and food allergies.  Neurological: Negative for headaches.   Objective: BP 120/72 (BP Location: Left Arm, Patient Position: Sitting, Cuff Size: Large)   Pulse 95   Temp 98.7 F (37.1 C) (Oral)   Resp 18   Ht 5\' 2"  (1.575 m)   Wt 212 lb (96.2 kg)   SpO2 97%   BMI 38.78 kg/m  Body mass index is 38.78 kg/m. Physical Exam  Constitutional: She is oriented to person, place, and time. She appears well-developed and well-nourished.  HENT:  Head: Normocephalic and atraumatic.  Right Ear: External ear normal.  Left Ear: External ear normal.  Nose: Nose normal.  Mouth/Throat: Oropharynx is clear and moist.  Eyes: Conjunctivae and EOM are normal.  Neck: Neck supple.  Cardiovascular: Normal rate, regular rhythm and normal heart sounds. Exam reveals no gallop and no friction rub.  No murmur heard. Pulmonary/Chest: Effort normal and breath sounds normal. She has no wheezes. She has no rales.  Abdominal: Soft.  Lymphadenopathy:    She has no cervical adenopathy.  Neurological: She is alert and oriented to person, place, and time.  Skin: Skin is warm.  No rash noted.  Psychiatric: She has a normal mood and affect. Her behavior is normal.  Nursing note and vitals reviewed.  The plan was reviewed with the patient/family, and all questions/concerned were addressed.  It was my pleasure to see Darrold SpanKaneesha today and participate in her care. Please feel free to contact me with any questions or concerns.  Sincerely,  Wyline MoodYoon Kim, DO Allergy & Immunology  [facility] [place] office: [phone] [facility] office: [phone]

## 2019-03-01 LAB — OB RESULTS CONSOLE RPR: RPR: NONREACTIVE

## 2019-03-19 ENCOUNTER — Ambulatory Visit: Payer: BLUE CROSS/BLUE SHIELD | Admitting: Allergy

## 2019-05-11 LAB — OB RESULTS CONSOLE GBS: GBS: NEGATIVE

## 2019-05-26 ENCOUNTER — Inpatient Hospital Stay (HOSPITAL_COMMUNITY)
Admission: AD | Admit: 2019-05-26 | Discharge: 2019-05-29 | DRG: 807 | Disposition: A | Discharge status: Home / Self Care | Payer: BC Managed Care – PPO | Attending: Obstetrics and Gynecology | Admitting: Obstetrics and Gynecology

## 2019-05-26 ENCOUNTER — Encounter: Payer: Self-pay | Admitting: Student

## 2019-05-26 ENCOUNTER — Other Ambulatory Visit: Payer: Self-pay

## 2019-05-26 DIAGNOSIS — O134 Gestational [pregnancy-induced] hypertension without significant proteinuria, complicating childbirth: Principal | ICD-10-CM | POA: Diagnosis present

## 2019-05-26 DIAGNOSIS — Z3A38 38 weeks gestation of pregnancy: Secondary | ICD-10-CM

## 2019-05-26 DIAGNOSIS — O99214 Obesity complicating childbirth: Secondary | ICD-10-CM | POA: Diagnosis present

## 2019-05-26 DIAGNOSIS — E669 Obesity, unspecified: Secondary | ICD-10-CM | POA: Diagnosis present

## 2019-05-26 DIAGNOSIS — Z20828 Contact with and (suspected) exposure to other viral communicable diseases: Secondary | ICD-10-CM | POA: Diagnosis present

## 2019-05-26 DIAGNOSIS — O133 Gestational [pregnancy-induced] hypertension without significant proteinuria, third trimester: Secondary | ICD-10-CM

## 2019-05-26 LAB — URINALYSIS, ROUTINE W REFLEX MICROSCOPIC
Bacteria, UA: NONE SEEN
Bilirubin Urine: NEGATIVE
Glucose, UA: NEGATIVE mg/dL
Ketones, ur: NEGATIVE mg/dL
Leukocytes,Ua: NEGATIVE
Nitrite: NEGATIVE
Protein, ur: NEGATIVE mg/dL
Specific Gravity, Urine: 1.012 (ref 1.005–1.030)
pH: 6 (ref 5.0–8.0)

## 2019-05-26 LAB — TYPE AND SCREEN
ABO/RH(D): O POS
Antibody Screen: NEGATIVE

## 2019-05-26 LAB — CBC
HCT: 34.1 % — ABNORMAL LOW (ref 36.0–46.0)
Hemoglobin: 11.8 g/dL — ABNORMAL LOW (ref 12.0–15.0)
MCH: 29.7 pg (ref 26.0–34.0)
MCHC: 34.6 g/dL (ref 30.0–36.0)
MCV: 85.9 fL (ref 80.0–100.0)
Platelets: 234 10*3/uL (ref 150–400)
RBC: 3.97 MIL/uL (ref 3.87–5.11)
RDW: 13.3 % (ref 11.5–15.5)
WBC: 6.1 10*3/uL (ref 4.0–10.5)
nRBC: 0 % (ref 0.0–0.2)

## 2019-05-26 LAB — COMPREHENSIVE METABOLIC PANEL
ALT: 18 U/L (ref 0–44)
AST: 20 U/L (ref 15–41)
Albumin: 2.7 g/dL — ABNORMAL LOW (ref 3.5–5.0)
Alkaline Phosphatase: 230 U/L — ABNORMAL HIGH (ref 38–126)
Anion gap: 10 (ref 5–15)
BUN: 5 mg/dL — ABNORMAL LOW (ref 6–20)
CO2: 21 mmol/L — ABNORMAL LOW (ref 22–32)
Calcium: 8.9 mg/dL (ref 8.9–10.3)
Chloride: 106 mmol/L (ref 98–111)
Creatinine, Ser: 0.7 mg/dL (ref 0.44–1.00)
GFR calc Af Amer: >60 mL/min (ref >60)
GFR calc non Af Amer: >60 mL/min (ref >60)
Glucose, Bld: 89 mg/dL (ref 70–99)
Potassium: 3.6 mmol/L (ref 3.5–5.1)
Sodium: 137 mmol/L (ref 135–145)
Total Bilirubin: 0.4 mg/dL (ref 0.3–1.2)
Total Protein: 6.4 g/dL — ABNORMAL LOW (ref 6.5–8.1)

## 2019-05-26 LAB — SARS CORONAVIRUS 2 BY RT PCR (HOSPITAL ORDER, PERFORMED IN ~~LOC~~ HOSPITAL LAB): SARS Coronavirus 2: NEGATIVE

## 2019-05-26 LAB — PROTEIN / CREATININE RATIO, URINE
Creatinine, Urine: 124.37 mg/dL
Protein Creatinine Ratio: 0.06 mg/mg{Cre} (ref 0.00–0.15)
Total Protein, Urine: 8 mg/dL

## 2019-05-26 LAB — ABO/RH: ABO/RH(D): O POS

## 2019-05-26 MED ORDER — SOD CITRATE-CITRIC ACID 500-334 MG/5ML PO SOLN: 30.0000 mL | ORAL | Status: DC | PRN

## 2019-05-26 MED ORDER — OXYCODONE-ACETAMINOPHEN 5-325 MG PO TABS: 1.0000 | ORAL_TABLET | ORAL | Status: DC | PRN

## 2019-05-26 MED ORDER — FENTANYL CITRATE (PF) 100 MCG/2ML IJ SOLN
50.0000 ug | INTRAMUSCULAR | Status: DC | PRN
  Administered 2019-05-26: 21:00:00 50 ug via INTRAVENOUS
  Filled 2019-05-26: qty 2

## 2019-05-26 MED ORDER — OXYTOCIN 40 UNITS IN NORMAL SALINE INFUSION - SIMPLE MED
2.5000 [IU]/h | INTRAVENOUS | Status: DC
  Filled 2019-05-26: qty 1000

## 2019-05-26 MED ORDER — LIDOCAINE HCL (PF) 1 % IJ SOLN
30.0000 mL | INTRAMUSCULAR | Status: AC | PRN
  Administered 2019-05-26: 30 mL via SUBCUTANEOUS
  Filled 2019-05-26: qty 30

## 2019-05-26 MED ORDER — OXYTOCIN 40 UNITS IN NORMAL SALINE INFUSION - SIMPLE MED
1.0000 m[IU]/min | INTRAVENOUS | Status: DC
  Administered 2019-05-26: 20:00:00 2 m[IU]/min via INTRAVENOUS

## 2019-05-26 MED ORDER — LACTATED RINGERS IV SOLN: 500.0000 mL | Freq: Once | INTRAVENOUS | Status: DC

## 2019-05-26 MED ORDER — OXYTOCIN 10 UNIT/ML IJ SOLN
INTRAMUSCULAR | Status: AC
  Administered 2019-05-26: 10 [IU]
  Filled 2019-05-26: qty 1

## 2019-05-26 MED ORDER — PHENYLEPHRINE 40 MCG/ML (10ML) SYRINGE FOR IV PUSH (FOR BLOOD PRESSURE SUPPORT): 80.0000 ug | PREFILLED_SYRINGE | INTRAVENOUS | Status: DC | PRN

## 2019-05-26 MED ORDER — LACTATED RINGERS IV SOLN
INTRAVENOUS | Status: DC
  Administered 2019-05-26: 14:00:00 via INTRAVENOUS

## 2019-05-26 MED ORDER — ACETAMINOPHEN 325 MG PO TABS: 650.0000 mg | ORAL_TABLET | ORAL | Status: DC | PRN

## 2019-05-26 MED ORDER — TERBUTALINE SULFATE 1 MG/ML IJ SOLN: 0.2500 mg | Freq: Once | INTRAMUSCULAR | Status: DC | PRN

## 2019-05-26 MED ORDER — FENTANYL-BUPIVACAINE-NACL 0.5-0.125-0.9 MG/250ML-% EP SOLN: 12.0000 mL/h | EPIDURAL | Status: DC | PRN

## 2019-05-26 MED ORDER — OXYTOCIN BOLUS FROM INFUSION: 500.0000 mL | Freq: Once | INTRAVENOUS | Status: DC

## 2019-05-26 MED ORDER — EPHEDRINE 5 MG/ML INJ: 10.0000 mg | INTRAVENOUS | Status: DC | PRN

## 2019-05-26 MED ORDER — DIPHENHYDRAMINE HCL 50 MG/ML IJ SOLN: 12.5000 mg | INTRAMUSCULAR | Status: DC | PRN

## 2019-05-26 MED ORDER — ONDANSETRON HCL 4 MG/2ML IJ SOLN: 4.0000 mg | Freq: Four times a day (QID) | INTRAMUSCULAR | Status: DC | PRN

## 2019-05-26 MED ORDER — OXYCODONE-ACETAMINOPHEN 5-325 MG PO TABS: 2.0000 | ORAL_TABLET | ORAL | Status: DC | PRN

## 2019-05-26 MED ORDER — LACTATED RINGERS IV SOLN: 500.0000 mL | INTRAVENOUS | Status: DC | PRN

## 2019-05-26 MED ORDER — FENTANYL CITRATE (PF) 100 MCG/2ML IJ SOLN: 100.0000 ug | INTRAMUSCULAR | Status: DC | PRN

## 2019-05-26 NOTE — MAU Note (Signed)
Jane Ellison is a 28 y.o. at [redacted]w[redacted]d here in MAU reporting: had membranes swept yesterday and felt cramping. Since 1800 yesterday she is stating the pains are more consistent. They come every 15-20 minutes and is now feeling it in her back. Reporting some spotting when she wipes, no LOF. Feeling FM but it is less than normal. Was 1 cm yesterday  Onset of complaint: last night  Pain score: 8/10  Vitals:   05/26/19 1116  BP: (!) 141/103  Pulse: 81  Resp: 16  Temp: 99.3 F (37.4 C)  SpO2: 98%     FHT:142  Lab orders placed from triage: none

## 2019-05-26 NOTE — H&P (Signed)
Jane MassingKaneesha Ellison is a 28 y.o. female presenting complaining of contractions. She was noted to have elevated bp 140-150/90-100 in MAU... PIH labs negative. Diagnosis of gestational hypertension admit for induction. Pregnancy otherwise uncomplicated. Prenatal care provided by Dr. Myna [facility]idal[facility]ennifer [facility]zan [facility] History    Gravida  2   Para  0   Term  0   Preterm  0   AB  1   Living  0     SAB  1   TAB  0   Ectopic  0   Multiple  0   Live Births  0          Past Medical History:  Diagnosis Date  . Angio-edema   . Asthma    rarely uses inhaler  . Eczema   . Head ache   . Hyperlipidemia    no meds, diet controlled  . Seasonal allergies    Past Surgical History:  Procedure Laterality Date  . WISDOM TOOTH EXTRACTION     Family History: family history includes Allergy (severe) in her mother; Hypertension in her mother. Social History:  reports that she has never smoked. She has never used smokeless tobacco. She reports previous alcohol use. She reports that she does not use drugs.  Allergies: shellfish/ iodine/ tree nuts all cause anaphylaxis     Maternal Diabetes: No Genetic Screening: Normal Maternal Ultrasounds/Referrals: Normal Fetal Ultrasounds or other Referrals:  None Maternal Substance Abuse:  No Significant Maternal Medications:  None Significant Maternal Lab Results:  Group B Strep negative Other Comments:  None  Review of Systems  Constitutional: Negative.   HENT: Negative.   Eyes: Negative.   Respiratory: Negative.   Cardiovascular: Negative.   Gastrointestinal: Negative.   Genitourinary: Negative.   Musculoskeletal: Negative.   Skin: Negative.   Neurological: Negative.   Endo/Heme/Allergies: Negative.   Psychiatric/Behavioral: Negative.    Maternal Medical History:  Reason for admission: Contractions.   Contractions: Onset was 6-12 hours ago.   Frequency: regular.   Duration is approximately 60 seconds.   Perceived severity is  moderate.    Fetal activity: Perceived fetal activity is normal.   Last perceived fetal movement was within the past hour.    Prenatal complications: no prenatal complications Prenatal Complications - Diabetes: none.    Dilation: 3 Effacement (%): 90 Station: -2 Exam by:: Richardson Doppole MD Blood pressure (!) 137/97, pulse 99, temperature 98.1 F (36.7 C), temperature source Oral, resp. rate 16, height 5\' 2"  (1.575 m), weight 104.9 kg, last menstrual period 08/28/2018, SpO2 98 %, unknown if currently breastfeeding. Maternal Exam:  Uterine Assessment: Contraction frequency is regular.   Abdomen: Patient reports no abdominal tenderness. Fetal presentation: vertex  Introitus: Normal vulva. Normal vagina.  Pelvis: adequate for delivery.      Fetal Exam Fetal Monitor Review: Baseline rate: 145.  Variability: moderate (6-25 bpm).   Pattern: accelerations present and no decelerations.    Fetal State Assessment: Category I - tracings are normal.     Physical Exam  Vitals reviewed. Constitutional: She is oriented to person, place, and time. She appears well-developed and well-nourished.  HENT:  Head: Normocephalic and atraumatic.  Eyes: Pupils are equal, round, and reactive to light. Conjunctivae are normal.  Neck: Normal range of motion. Neck supple.  Cardiovascular: Normal rate and regular rhythm.  Respiratory: Effort normal and breath sounds normal.  GI: There is no abdominal tenderness.  Genitourinary:    Vulva and vagina normal.   Musculoskeletal: Normal range of motion.  General: No edema.  Neurological: She is alert and oriented to person, place, and time. She has normal reflexes.  Skin: Skin is warm and dry.  Psychiatric: She has a normal mood and affect.   Cervix 3/90/-2  Prenatal labs: ABO, Rh: --/--/O POS, O POS Performed at [facility], [place] [address] [place], [place] [address]  [id number]) Antibody: NEG (08/15 1201) Rubella: Immune (01/08 0000) RPR:  Nonreactive (05/21 0000)  HBsAg:   Negative  HIV: Non-reactive (01/08 0000)  GBS: Negative (07/31 0000)   Assessment/Plan: 38 wks and 5 days in latent labor Recheck cervix at 8 pm if no change plan to start pitocin   Gestational hypertension. - will monitor bp closely  Fetal wellbeing- Category 1 Anticipate SVD   Christophe Louis 05/26/2019, 5:03 PM

## 2019-05-26 NOTE — MAU Provider Note (Signed)
Patient Jane MassingKaneesha Ellison is a 28 y.o. G2P0010 At 6171w5d here with complaints of contractions and vaginal spotting. She also feels like her baby is moving less over the past few days. She mentioned this at her OB appt, but she was reassured because the baby has never been much of an active mover.   She denies HA, blurry vision, nausea, floating spots. She also  History     CSN: [id number]  Arrival date and time: 05/26/19 1055   First Provider Initiated Contact with Patient 05/26/19 1141      Chief Complaint  Patient presents with  . Contractions  . Decreased Fetal Movement   Abdominal Pain This is a new problem. The current episode started yesterday. The onset quality is sudden. Episode frequency: every 15 minutes. They started yesterday night at 6 pm.  The pain is at a severity of 8/10. The quality of the pain is cramping. Associated symptoms include diarrhea. Pertinent negatives include no dysuria or vomiting. Associated symptoms comments: Diarrhea all week; sometimes loose and sometimes watery. .    OB History    Gravida  2   Para  0   Term  0   Preterm  0   AB  1   Living  0     SAB  1   TAB  0   Ectopic  0   Multiple  0   Live Births  0           Past Medical History:  Diagnosis Date  . Angio-edema   . Asthma    rarely uses inhaler  . Eczema   . Head ache   . Hyperlipidemia    no meds, diet controlled  . Seasonal allergies     Past Surgical History:  Procedure Laterality Date  . WISDOM TOOTH EXTRACTION      Family History  Problem Relation Age of Onset  . Hypertension Mother   . Allergy (severe) Mother        Grass  . Allergic rhinitis Neg Hx   . Angioedema Neg Hx   . Asthma Neg Hx   . Atopy Neg Hx   . Eczema Neg Hx   . Immunodeficiency Neg Hx   . Urticaria Neg Hx     Social History   Tobacco Use  . Smoking status: Never Smoker  . Smokeless tobacco: Never Used  Substance Use Topics  . Alcohol use: Not Currently    Comment: not  in pregnancy  . Drug use: Never    Allergies:  Allergies  Allergen Reactions  . Other Anaphylaxis    Tree Nuts Only  . Shellfish Allergy Anaphylaxis  . Iodinated Diagnostic Agents     Medications Prior to Admission  Medication Sig Dispense Refill Last Dose  . cetirizine (ZYRTEC) 10 MG tablet Take 10 mg by mouth daily.   05/25/2019 at Unknown time  . fluticasone (FLONASE) 50 MCG/ACT nasal spray Place 1 spray into both nostrils daily.   05/25/2019 at Unknown time  . triamcinolone cream (KENALOG) 0.1 % Apply 1 application topically daily as needed (Dry skin).   6 05/26/2019 at Unknown time  . albuterol (PROVENTIL HFA;VENTOLIN HFA) 108 (90 Base) MCG/ACT inhaler Inhale 2 puffs into the lungs every 4 (four) hours as needed for wheezing or shortness of breath. 18 g 1 More than a month at Unknown time  . AUVI-Q 0.3 MG/0.3ML SOAJ injection Inject 0.3 mLs (0.3 mg total) into the muscle as needed for anaphylaxis. 2 Device 1 More  than a month at Unknown time  . ibuprofen (ADVIL,MOTRIN) 600 MG tablet Take 600 mg by mouth every 6 (six) hours as needed for moderate pain.   More than a month at Unknown time    Review of Systems  Constitutional: Negative.   HENT: Negative.   Respiratory: Negative.   Gastrointestinal: Positive for abdominal pain and diarrhea. Negative for vomiting.  Genitourinary: Negative for dysuria.  Musculoskeletal: Negative.   Neurological: Negative.   Psychiatric/Behavioral: Negative.    Physical Exam   Blood pressure (!) 153/90, pulse 72, temperature 99.3 F (37.4 C), temperature source Oral, resp. rate 16, height 5\' 2"  (1.575 m), weight 104.9 kg, last menstrual period 08/28/2018, SpO2 98 %, unknown if currently breastfeeding.  Physical Exam  Constitutional: She is oriented to person, place, and time. She appears well-developed and well-nourished.  Neck: Normal range of motion.  Respiratory: Effort normal.  GI: Soft.  Musculoskeletal: Normal range of motion.   Neurological: She is alert and oriented to person, place, and time.  Skin: Skin is warm and dry.    MAU Course  Procedures  MDM Patient Vitals for the past 24 hrs:  BP Temp Temp src Pulse Resp SpO2 Height Weight  05/26/19 1300 (!) 153/90 - - 72 - - - -  05/26/19 1245 (!) 144/96 - - 75 - - - -  05/26/19 1230 (!) 139/97 - - 81 - - - -  05/26/19 1215 (!) 146/95 - - 76 - - - -  05/26/19 1200 (!) 143/99 - - - - - - -  05/26/19 1145 (!) 146/100 - - 84 - - - -  05/26/19 1125 (!) 151/89 - - 88 - - - -  05/26/19 1116 (!) 141/103 99.3 F (37.4 C) Oral 81 16 98 % - -  05/26/19 1111 - - - - - - 5\' 2"  (1.575 m) 104.9 kg   -BP is elevated in MAU; will draw pre-e labs.  -patient's tracing initially with minimal variability; will give Sprite as patient reports she had pizza for breakfast 4 hours ago.  -BPs have been consistently elevated while in MAU, although patient is asymptomatic. Pre-e labs were negative.  -NST: baseline is 140, mod var, present acel, no decels, ctx now more frequent at 2 min apart.  Assessment and Plan   1. Gestational hypertension, third trimester    2. Discussed with Dr. Elly Modena, who recommends admission. Dr. Landry Mellow notified, and agreed.  -will do Covid testing -patient can have light labor diet when she gets to L and D -RN to recheck after patient has been on L and D for one hour to let Dr. Landry Mellow know about cervical exam and determine whether to start pitocin or cytotec  Mervyn Skeeters [facility] 05/26/2019, 2:05 PM

## 2019-05-27 LAB — CBC
HCT: 31.9 % — ABNORMAL LOW (ref 36.0–46.0)
Hemoglobin: 11.3 g/dL — ABNORMAL LOW (ref 12.0–15.0)
MCH: 30.4 pg (ref 26.0–34.0)
MCHC: 35.4 g/dL (ref 30.0–36.0)
MCV: 85.8 fL (ref 80.0–100.0)
Platelets: 216 10*3/uL (ref 150–400)
RBC: 3.72 MIL/uL — ABNORMAL LOW (ref 3.87–5.11)
RDW: 13.3 % (ref 11.5–15.5)
WBC: 10.1 10*3/uL (ref 4.0–10.5)
nRBC: 0 % (ref 0.0–0.2)

## 2019-05-27 LAB — RPR: RPR Ser Ql: NONREACTIVE

## 2019-05-27 MED ORDER — ZOLPIDEM TARTRATE 5 MG PO TABS: 5.0000 mg | ORAL_TABLET | Freq: Every evening | ORAL | Status: DC | PRN

## 2019-05-27 MED ORDER — DIPHENHYDRAMINE HCL 25 MG PO CAPS: 25.0000 mg | ORAL_CAPSULE | Freq: Four times a day (QID) | ORAL | Status: DC | PRN

## 2019-05-27 MED ORDER — DIBUCAINE (PERIANAL) 1 % EX OINT: 1.0000 "application " | TOPICAL_OINTMENT | CUTANEOUS | Status: DC | PRN

## 2019-05-27 MED ORDER — FLUTICASONE PROPIONATE 50 MCG/ACT NA SUSP
1.0000 | Freq: Every day | NASAL | Status: DC
  Filled 2019-05-27: qty 16

## 2019-05-27 MED ORDER — ALBUTEROL SULFATE (2.5 MG/3ML) 0.083% IN NEBU: 3.0000 mL | INHALATION_SOLUTION | RESPIRATORY_TRACT | Status: DC | PRN

## 2019-05-27 MED ORDER — SENNOSIDES-DOCUSATE SODIUM 8.6-50 MG PO TABS
2.0000 | ORAL_TABLET | ORAL | Status: DC
  Administered 2019-05-27 – 2019-05-29 (×3): 2 via ORAL
  Filled 2019-05-27 (×3): qty 2

## 2019-05-27 MED ORDER — PRENATAL MULTIVITAMIN CH
1.0000 | ORAL_TABLET | Freq: Every day | ORAL | Status: DC
  Administered 2019-05-27 – 2019-05-29 (×3): 1 via ORAL
  Filled 2019-05-27 (×3): qty 1

## 2019-05-27 MED ORDER — COCONUT OIL OIL: 1.0000 "application " | TOPICAL_OIL | Status: DC | PRN

## 2019-05-27 MED ORDER — ACETAMINOPHEN 325 MG PO TABS: 650.0000 mg | ORAL_TABLET | ORAL | Status: DC | PRN

## 2019-05-27 MED ORDER — SIMETHICONE 80 MG PO CHEW: 80.0000 mg | CHEWABLE_TABLET | ORAL | Status: DC | PRN

## 2019-05-27 MED ORDER — ONDANSETRON HCL 4 MG/2ML IJ SOLN: 4.0000 mg | INTRAMUSCULAR | Status: DC | PRN

## 2019-05-27 MED ORDER — WITCH HAZEL-GLYCERIN EX PADS: 1.0000 "application " | MEDICATED_PAD | CUTANEOUS | Status: DC | PRN

## 2019-05-27 MED ORDER — BENZOCAINE-MENTHOL 20-0.5 % EX AERO
1.0000 "application " | INHALATION_SPRAY | CUTANEOUS | Status: DC | PRN
  Administered 2019-05-27: 1 via TOPICAL
  Filled 2019-05-27: qty 56

## 2019-05-27 MED ORDER — NIFEDIPINE ER OSMOTIC RELEASE 30 MG PO TB24
30.0000 mg | ORAL_TABLET | Freq: Every day | ORAL | Status: DC
  Administered 2019-05-27: 30 mg via ORAL
  Filled 2019-05-27: qty 1

## 2019-05-27 MED ORDER — IBUPROFEN 600 MG PO TABS
600.0000 mg | ORAL_TABLET | Freq: Four times a day (QID) | ORAL | Status: DC
  Administered 2019-05-27 – 2019-05-29 (×11): 600 mg via ORAL
  Filled 2019-05-27 (×11): qty 1

## 2019-05-27 MED ORDER — ONDANSETRON HCL 4 MG PO TABS: 4.0000 mg | ORAL_TABLET | ORAL | Status: DC | PRN

## 2019-05-27 NOTE — Plan of Care (Signed)
  Problem: Education: Goal: Knowledge of condition will improve Outcome: Completed/Met   Problem: Role Relationship: Goal: Ability to demonstrate positive interaction with newborn will improve Outcome: Completed/Met

## 2019-05-27 NOTE — Lactation Note (Signed)
This note was copied from a baby's chart. Lactation Consultation Note  Patient Name: Jane Ellison GGYIR'S Date: 05/27/2019 Reason for consult: Initial assessment;Early term 37-38.6wks;Primapara;1st time breastfeeding  LC in to visit with P68 Mom of ET infant at 27 hrs old.  Baby +DAT and bilirubin level at 13 hrs old was 6.3.  Talked about normal sleepiness with elevated bilirubin levels.  Baby has latched to breast 3 times with latch scores of 7-8.    Offered to assist with positioning and latching.  Mom has large, heavy breasts with erect nipples and compressible areolas.  Reviewed breast massage and hand expression, colostrum expressed easily.  Baby too sleepy to latch and feed, and she had a small amount of yellow emesis.    Demonstrated hand expression into spoon.  FOB taught how to spoon feed.  Baby took 3 ml colostrum.   Set up DEBP at bedside and assisted Mom with first pumping on initiation setting.  Mom and FOB given instructions on disassembling pump parts, washing, rinsing and air drying in separate bin.    Plan- 1- Keep baby STS as much as possible 2- Offer the breast when baby cues, or awaken if she last fed 3 hrs ago. 3- Breast feed, asking for help with latching prn. 4- Pump both breasts 15 mins on initiation setting 5- hand express into spoon and feed baby any EBM.  Mom has a Medela DEBP for home use. Lactation brochure left in room.  Mom aware of IP and OP lactation support available to her. Mom to ask for help prn.   Interventions Interventions: Breast feeding basics reviewed;Assisted with latch;Skin to skin;Breast massage;Hand express;Support pillows;Position options;Expressed milk;DEBP  Lactation Tools Discussed/Used Tools: Pump Breast pump type: Double-Electric Breast Pump WIC Program: No Pump Review: Setup, frequency, and cleaning;Milk Storage Initiated by:: Cipriano Mile RN IBCLC Date initiated:: 05/27/19   Consult Status Consult Status:  Follow-up Date: 05/28/19 Follow-up type: In-patient    Broadus John 05/27/2019, 2:48 PM

## 2019-05-27 NOTE — Progress Notes (Signed)
Pt blood pressure was 138/100 at 1142.  Informed MD, MD stated to continue to monitor, if B/P is greater than 150/100, contact MD again

## 2019-05-27 NOTE — Progress Notes (Signed)
Post Partum Day 1SVD Subjective: no complaints, up ad lib, voiding and tolerating PO  Objective: Blood pressure 125/78, pulse 77, temperature 98 F (36.7 C), temperature source Oral, resp. rate 18, height 5\' 2"  (1.575 m), weight 104.9 kg, last menstrual period 08/28/2018, SpO2 98 %, unknown if currently breastfeeding.  Physical Exam:  General: alert, cooperative and no distress Lochia: appropriate Uterine Fundus: firm Incision: NA DVT Evaluation: No evidence of DVT seen on physical exam.  Recent Labs    05/26/19 1201 05/27/19 0446  HGB 11.8* 11.3*  HCT 34.1* 31.9*    Assessment/Plan: Plan for discharge tomorrow and Breastfeeding  Gestational hypertension.Marland Kitchen bp normal since midnight on no medication.continue to monitor bp check in the office in 1 week  Routine postpartum care    LOS: 1 day   Jane Ellison 05/27/2019, 6:34 AM

## 2019-05-28 LAB — PROTEIN / CREATININE RATIO, URINE
Creatinine, Urine: 89.5 mg/dL
Protein Creatinine Ratio: 0.13 mg/mg{Cre} (ref 0.00–0.15)
Total Protein, Urine: 12 mg/dL

## 2019-05-28 LAB — COMPREHENSIVE METABOLIC PANEL
ALT: 20 U/L (ref 0–44)
AST: 30 U/L (ref 15–41)
Albumin: 2.7 g/dL — ABNORMAL LOW (ref 3.5–5.0)
Alkaline Phosphatase: 217 U/L — ABNORMAL HIGH (ref 38–126)
Anion gap: 9 (ref 5–15)
BUN: <5 mg/dL — ABNORMAL LOW (ref 6–20)
CO2: 23 mmol/L (ref 22–32)
Calcium: 8.6 mg/dL — ABNORMAL LOW (ref 8.9–10.3)
Chloride: 105 mmol/L (ref 98–111)
Creatinine, Ser: 0.69 mg/dL (ref 0.44–1.00)
GFR calc Af Amer: >60 mL/min (ref >60)
GFR calc non Af Amer: >60 mL/min (ref >60)
Glucose, Bld: 78 mg/dL (ref 70–99)
Potassium: 3.9 mmol/L (ref 3.5–5.1)
Sodium: 137 mmol/L (ref 135–145)
Total Bilirubin: 0.3 mg/dL (ref 0.3–1.2)
Total Protein: 6.5 g/dL (ref 6.5–8.1)

## 2019-05-28 MED ORDER — NIFEDIPINE ER OSMOTIC RELEASE 30 MG PO TB24
60.0000 mg | ORAL_TABLET | Freq: Every day | ORAL | Status: DC
  Administered 2019-05-28 – 2019-05-29 (×2): 60 mg via ORAL
  Filled 2019-05-28 (×2): qty 2

## 2019-05-28 MED ORDER — NIFEDIPINE ER OSMOTIC RELEASE 30 MG PO TB24: 30.0000 mg | ORAL_TABLET | Freq: Every day | ORAL | Status: DC | PRN

## 2019-05-28 MED ORDER — NIFEDIPINE ER OSMOTIC RELEASE 30 MG PO TB24: 30.0000 mg | ORAL_TABLET | Freq: Once | ORAL | Status: DC

## 2019-05-28 NOTE — Progress Notes (Signed)
Postpartum day #1, NSVD  Subjective Pt without complaints.  Lochia normal.  Pain controlled.  Breast feeding yes. Pt denies headaches, visual changes, upper abdominal pain or SOB.  Temp:  [98 F (36.7 C)-98.5 F (36.9 C)] 98 F (36.7 C) (08/17 0556) Pulse Rate:  [62-97] 89 (08/17 0556) Resp:  [16-19] 16 (08/17 0556) BP: (128-157)/(80-104) 148/104 (08/17 0556) SpO2:  [97 %-99 %] 99 % (08/17 0556)  Gen:  NAD, A&O x 3 Abd:  No upper abdominal pain. Uterine fundus:  Firm, nontender Lochia normal Ext:  +Edema, no calf tenderness bilaterally Neuro:  DTR 1+  CBC Latest Ref Rng & Units 05/27/2019 05/26/2019 12/13/2008  WBC 4.0 - 10.5 K/uL 10.1 6.1 4.6  Hemoglobin 12.0 - 15.0 g/dL 11.3(L) 11.8(L) 13.5  Hematocrit 36.0 - 46.0 % 31.9(L) 34.1(L) 39.8  Platelets 150 - 400 K/uL 216 234 283   CMP Latest Ref Rng & Units 05/26/2019 12/13/2008  Glucose 70 - 99 mg/dL 89 98  BUN 6 - 20 mg/dL 5(L) 7  Creatinine 0.44 - 1.00 mg/dL 0.70 0.72  Sodium 135 - 145 mmol/L 137 139  Potassium 3.5 - 5.1 mmol/L 3.6 3.8  Chloride 98 - 111 mmol/L 106 107  CO2 22 - 32 mmol/L 21(L) 28  Calcium 8.9 - 10.3 mg/dL 8.9 8.7  Total Protein 6.5 - 8.1 g/dL 6.4(L) -  Total Bilirubin 0.3 - 1.2 mg/dL 0.4 -  Alkaline Phos 38 - 126 U/L 230(H) -  AST 15 - 41 U/L 20 -  ALT 0 - 44 U/L 18 -     PCR: 0.06    A/P: S/p SVD doing well. Gestational HTN on Procardia XL 30 mg x 12 hours. No s/sxs of Preeclampsia other than elevated BP.  Uncontrolled BP, diastolic BP >364  Pt due for 30 mg this am per pharmacy.  Change to 60 mg daily starting this am.  Repeat CMP and PCR (cath specimen).  Vital signs q 4, Strict I/Os. Continue other Routine postpartum care. Lactation support. Discharge home when BP controlled.  Thurnell Lose 05/28/2019, 8:55 AM

## 2019-05-28 NOTE — Lactation Note (Addendum)
This note was copied from a baby's chart. Lactation Consultation Note  Patient Name: Jane Ellison LJQGB'E Date: 05/28/2019  Infant is 45 hrs old. Mom says feedings are going well. She is primarily bottle feeding with EBM & formula. Mom had been pumping around the clock, but took some time off from pumping earlier today.  I reassured Mom & encouraged her to continue pumping to help with lactogenesis II.   Mom has a Medela pump at home.  Parents have volume parameters based on day of life, but I emphasized feeding infant until she is content. Positioning & basic technique for paced-bottle feed was discussed with parents.   Mom is on nifedipine 60mg  qd (L2).  Jane Ellison [facility] 05/28/2019, 2:19 PM

## 2019-05-29 ENCOUNTER — Encounter (HOSPITAL_COMMUNITY): Payer: Self-pay | Admitting: *Deleted

## 2019-05-29 MED ORDER — IBUPROFEN 600 MG PO TABS: 600.0000 mg | ORAL_TABLET | Freq: Four times a day (QID) | ORAL | 0 refills | Status: DC | PRN

## 2019-05-29 MED ORDER — NIFEDIPINE ER 60 MG PO TB24: 60.0000 mg | ORAL_TABLET | Freq: Every day | ORAL | 1 refills | Status: DC

## 2019-05-29 NOTE — Lactation Note (Signed)
This note was copied from a baby's chart. Lactation Consultation Note  Patient Name: Jane Ellison BOFBP'Z Date: 05/29/2019   Baby 13 hours old.  Mother is pumping bottle feeding and formula feeding. Mother states she would like to breastfeed but has had difficulty with latching. Encouraged her to call today before discharge to receive assistance w/ latching. Discussed breast before formula and pumping at least 8 times per day if not going to the breast to help establish her milk supply. Feed on demand approximately 8-12 times per day.   Discussed OP appt.  engorgegment care and monitoring voids/stools.      Maternal Data    Feeding Feeding Type: Bottle Fed - Formula  LATCH Score                   Interventions    Lactation Tools Discussed/Used     Consult Status      Carlye Grippe 05/29/2019, 9:13 AM

## 2019-05-29 NOTE — Progress Notes (Addendum)
Rn gave patient information on Preclampsia.

## 2019-05-29 NOTE — Lactation Note (Signed)
This note was copied from a baby's chart. Lactation Consultation Note  Patient Name: Jane Ellison KYHCW'C Date: 05/29/2019 Reason for consult: Follow-up assessment;Mother's request;Primapara;1st time breastfeeding;Hyperbilirubinemia;Early term 37-38.6wks  LC in to visit with P1 Mom of ET infant on day of discharge.  Baby 14 hrs old and at 6% weight loss.  Baby has been primarily bottle feeding and Mom pumping, due to sleepiness due to +DAT and elevated bilirubin.   Breasts filling and Mom wanting to latch baby to the breast more now.  Mom asking for a nipple shield, but baby was able to attain a deep latch in football hold.  Adjusted Mom's positioning for comfort, but baby latched deeply to breast, and swallowing identified.  Mom instructed to support her breast, and support her hand supporting baby's head.  When Mom hand expresses, milk spraying out.   Mom has a Medela DEBP at home.   Recommended she follow-up with OP lactation consultant to assess baby's ability to transfer well at the breast.  Baby looks great latched to breast currently.  Recommended keeping baby STS as much as possible.  Mom to pump to support her milk supply until after OP appointment.  Mom aware of OP lactation support available.  Request sent to OP Clinic.   Feeding Feeding Type: Breast Fed  LATCH Score Latch: Grasps breast easily, tongue down, lips flanged, rhythmical sucking.  Audible Swallowing: Spontaneous and intermittent  Type of Nipple: Everted at rest and after stimulation  Comfort (Breast/Nipple): Filling, red/small blisters or bruises, mild/mod discomfort(breasts filling)  Hold (Positioning): No assistance needed to correctly position infant at breast.  LATCH Score: 9  Interventions Interventions: Breast feeding basics reviewed;Skin to skin;Breast massage;Hand express;Breast compression;Adjust position;Support pillows;Position options;DEBP  Lactation Tools Discussed/Used Tools:  Pump Breast pump type: Double-Electric Breast Pump   Consult Status Consult Status: Complete Date: 05/29/19 Follow-up type: [place], Genevra Orne E 05/29/2019, 2:26 PM

## 2019-05-29 NOTE — Discharge Summary (Signed)
OB Discharge Summary     Patient Name: Jane Ellison DOB: 1991/01/31 MRN: [id number]  Date of admission: 05/26/2019 Delivering MD: Aviva SignsWILLIAMS, MARIE L   Date of discharge: 05/29/2019  Admitting diagnosis: stomach cramping, back pain, light bleeding Intrauterine pregnancy: 3636w1d     Secondary diagnosis:  Active Problems:   Indication for care in labor and delivery, antepartum  Additional problems: Gestational HTN, Obesity     Discharge diagnosis: Term Pregnancy Delivered and Gestational Hypertension                                                                                                Post partum procedures:n/a  Augmentation: AROM and Pitocin  Complications: None  Hospital course:  Induction of Labor With Vaginal Delivery   28 y.o. yo G2P0010 at 3336w1d was admitted to the hospital 05/26/2019 for induction of labor.  Indication for induction: Gestational hypertension.  Patient had an uncomplicated labor course as follows: Membrane Rupture Time/Date: 10:10 PM ,05/26/2019   Intrapartum Procedures: Episiotomy: None [1]                                         Lacerations:  1st degree [2];Periurethral [8];Perineal [11]  Patient had delivery of a Viable infant.  Information for the patient's newborn:  Valorie RooseveltSimmons, Girl Kinzy [[id number]][[id number]]  Delivery Method: Vag-Spont    05/26/2019  Details of delivery can be found in separate delivery note.  Patient had a routine postpartum course. Patient is discharged home 05/29/19.  Physical exam  Vitals:   05/28/19 1825 05/28/19 2205 05/29/19 0203 05/29/19 0627  BP: (!) 132/97 (!) 131/92 114/86 121/87  Pulse: (!) 111 79 86 89  Resp: 18 18 18 18   Temp: 99.5 F (37.5 C) 98.1 F (36.7 C)  98.3 F (36.8 C)  TempSrc: Oral Oral  Oral  SpO2: 99% 100%    Weight:      Height:       General: alert, cooperative and no distress Lochia: appropriate Uterine Fundus: firm Incision: N/A DVT Evaluation: No evidence of DVT seen on physical  exam. Labs: Lab Results  Component Value Date   WBC 10.1 05/27/2019   HGB 11.3 (L) 05/27/2019   HCT 31.9 (L) 05/27/2019   MCV 85.8 05/27/2019   PLT 216 05/27/2019   CMP Latest Ref Rng & Units 05/28/2019  Glucose 70 - 99 mg/dL 78  BUN 6 - 20 mg/dL <1(H<5(L)  Creatinine 0.860.44 - 1.00 mg/dL 5.780.69  Sodium 469135 - 629145 mmol/L 137  Potassium 3.5 - 5.1 mmol/L 3.9  Chloride 98 - 111 mmol/L 105  CO2 22 - 32 mmol/L 23  Calcium 8.9 - 10.3 mg/dL 5.2(W8.6(L)  Total Protein 6.5 - 8.1 g/dL 6.5  Total Bilirubin 0.3 - 1.2 mg/dL 0.3  Alkaline Phos 38 - 126 U/L 217(H)  AST 15 - 41 U/L 30  ALT 0 - 44 U/L 20    Discharge instruction: per After Visit Summary and "Baby and Me Booklet".  After visit meds:  Allergies as  of 05/29/2019      Reactions   Other Anaphylaxis   Tree Nuts Only   Shellfish Allergy Anaphylaxis   Iodinated Diagnostic Agents       Medication List    TAKE these medications   albuterol 108 (90 Base) MCG/ACT inhaler Commonly known as: VENTOLIN HFA Inhale 2 puffs into the lungs every 4 (four) hours as needed for wheezing or shortness of breath.   Auvi-Q 0.3 mg/0.3 mL Soaj injection Generic drug: EPINEPHrine Inject 0.3 mLs (0.3 mg total) into the muscle as needed for anaphylaxis.   cetirizine 10 MG tablet Commonly known as: ZYRTEC Take 10 mg by mouth daily.   fluticasone 50 MCG/ACT nasal spray Commonly known as: FLONASE Place 1 spray into both nostrils daily.   ibuprofen 600 MG tablet Commonly known as: ADVIL Take 1 tablet (600 mg total) by mouth every 6 (six) hours as needed. What changed: reasons to take this   NIFEdipine 60 MG 24 hr tablet Commonly known as: ADALAT CC Take 1 tablet (60 mg total) by mouth daily.   triamcinolone cream 0.1 % Commonly known as: KENALOG Apply 1 application topically daily as needed (Dry skin).       Diet: routine diet  Activity: Advance as tolerated. Pelvic rest for 6 weeks.   Outpatient follow up:1 week Follow up Appt:No future  appointments. Follow up Visit:No follow-ups on file.  Postpartum contraception: Not Discussed  Newborn Data: Live born female  Birth Weight: 6 lb 6.5 oz (2906 g) APGAR: 48, 9  Newborn Delivery   Birth date/time: 05/26/2019 22:28:00 Delivery type: Vaginal, Spontaneous      Baby Feeding: Breast Disposition:home with mother   05/29/2019 Annalee Genta, DO

## 2019-05-29 NOTE — Discharge Instructions (Signed)

## 2020-01-29 DIAGNOSIS — Z20828 Contact with and (suspected) exposure to other viral communicable diseases: Secondary | ICD-10-CM | POA: Diagnosis not present

## 2020-02-05 DIAGNOSIS — Z124 Encounter for screening for malignant neoplasm of cervix: Secondary | ICD-10-CM | POA: Diagnosis not present

## 2020-02-05 DIAGNOSIS — Z3009 Encounter for other general counseling and advice on contraception: Secondary | ICD-10-CM | POA: Diagnosis not present

## 2020-02-05 DIAGNOSIS — N9089 Other specified noninflammatory disorders of vulva and perineum: Secondary | ICD-10-CM | POA: Diagnosis not present

## 2020-02-05 DIAGNOSIS — Z113 Encounter for screening for infections with a predominantly sexual mode of transmission: Secondary | ICD-10-CM | POA: Diagnosis not present

## 2020-02-05 DIAGNOSIS — Z3202 Encounter for pregnancy test, result negative: Secondary | ICD-10-CM | POA: Diagnosis not present

## 2020-02-05 DIAGNOSIS — Z30430 Encounter for insertion of intrauterine contraceptive device: Secondary | ICD-10-CM | POA: Diagnosis not present

## 2020-02-18 DIAGNOSIS — R030 Elevated blood-pressure reading, without diagnosis of hypertension: Secondary | ICD-10-CM | POA: Diagnosis not present

## 2020-02-18 DIAGNOSIS — M79672 Pain in left foot: Secondary | ICD-10-CM | POA: Diagnosis not present

## 2020-03-20 DIAGNOSIS — Z30431 Encounter for routine checking of intrauterine contraceptive device: Secondary | ICD-10-CM | POA: Diagnosis not present

## 2020-04-15 DIAGNOSIS — I10 Essential (primary) hypertension: Secondary | ICD-10-CM | POA: Diagnosis not present

## 2020-08-27 DIAGNOSIS — Z01419 Encounter for gynecological examination (general) (routine) without abnormal findings: Secondary | ICD-10-CM | POA: Diagnosis not present

## 2020-08-27 DIAGNOSIS — Z30431 Encounter for routine checking of intrauterine contraceptive device: Secondary | ICD-10-CM | POA: Diagnosis not present

## 2020-10-16 DIAGNOSIS — Z03818 Encounter for observation for suspected exposure to other biological agents ruled out: Secondary | ICD-10-CM | POA: Diagnosis not present

## 2020-11-28 DIAGNOSIS — E669 Obesity, unspecified: Secondary | ICD-10-CM | POA: Diagnosis not present

## 2020-11-28 DIAGNOSIS — G43009 Migraine without aura, not intractable, without status migrainosus: Secondary | ICD-10-CM | POA: Diagnosis not present

## 2020-11-28 DIAGNOSIS — I10 Essential (primary) hypertension: Secondary | ICD-10-CM | POA: Diagnosis not present

## 2020-11-28 DIAGNOSIS — J4521 Mild intermittent asthma with (acute) exacerbation: Secondary | ICD-10-CM | POA: Diagnosis not present

## 2020-11-28 DIAGNOSIS — Z0000 Encounter for general adult medical examination without abnormal findings: Secondary | ICD-10-CM | POA: Diagnosis not present

## 2020-11-28 DIAGNOSIS — Z13220 Encounter for screening for lipoid disorders: Secondary | ICD-10-CM | POA: Diagnosis not present

## 2020-12-26 ENCOUNTER — Ambulatory Visit (HOSPITAL_COMMUNITY)
Admission: EM | Admit: 2020-12-26 | Discharge: 2020-12-26 | Disposition: A | Discharge status: Home / Self Care | Payer: 59 | Attending: Emergency Medicine | Admitting: Emergency Medicine

## 2020-12-26 ENCOUNTER — Encounter (HOSPITAL_COMMUNITY): Payer: Self-pay | Admitting: *Deleted

## 2020-12-26 ENCOUNTER — Other Ambulatory Visit: Payer: Self-pay

## 2020-12-26 DIAGNOSIS — R112 Nausea with vomiting, unspecified: Secondary | ICD-10-CM

## 2020-12-26 DIAGNOSIS — R519 Headache, unspecified: Secondary | ICD-10-CM | POA: Diagnosis not present

## 2020-12-26 MED ORDER — DEXAMETHASONE SODIUM PHOSPHATE 10 MG/ML IJ SOLN
10.0000 mg | Freq: Once | INTRAMUSCULAR | Status: AC
  Administered 2020-12-26: 10 mg via INTRAMUSCULAR

## 2020-12-26 MED ORDER — DEXAMETHASONE SODIUM PHOSPHATE 10 MG/ML IJ SOLN
INTRAMUSCULAR | Status: AC
  Filled 2020-12-26: qty 1

## 2020-12-26 MED ORDER — ONDANSETRON 4 MG PO TBDP: 4.0000 mg | ORAL_TABLET | Freq: Three times a day (TID) | ORAL | 0 refills | Status: DC | PRN

## 2020-12-26 MED ORDER — KETOROLAC TROMETHAMINE 30 MG/ML IJ SOLN
30.0000 mg | Freq: Once | INTRAMUSCULAR | Status: AC
  Administered 2020-12-26: 30 mg via INTRAMUSCULAR

## 2020-12-26 MED ORDER — KETOROLAC TROMETHAMINE 30 MG/ML IJ SOLN
INTRAMUSCULAR | Status: AC
  Filled 2020-12-26: qty 1

## 2020-12-26 NOTE — ED Provider Notes (Signed)
[facility]    CSN: [id number] Arrival date & time: 12/26/20  1826      History   Chief Complaint Chief Complaint  Patient presents with  . Headache    HPI Jane Ellison is a 30 y.o. female.   Jane Ellison is 30 year old with complaint of migraine that started yesterday.  Reports photophobia, nausea, and vomiting. Also reports dizziness and sweating x2.  Has tried Rapid Release Tylenol and imitrex with minimal relief.  Symptoms has somewhat resolved but still complaining of headache.  Reports symptoms similar to migraines in past.  Denies worst headache of her life.   Denies any fevers, chest pain, shortness of breath, diarrhea, or abdominal pain.    ROS: As per HPI, all other pertinent ROS negative    The history is provided by the patient.  Headache   Past Medical History:  Diagnosis Date  . Angio-edema   . Asthma    rarely uses inhaler  . Eczema   . Head ache   . Hyperlipidemia    no meds, diet controlled  . Seasonal allergies     Patient Active Problem List   Diagnosis Date Noted  . Indication for care in labor and delivery, antepartum 05/26/2019  . Mild intermittent asthma without complication 12/14/2018  . [redacted] weeks gestation of pregnancy 12/14/2018  . Seasonal and perennial allergic rhinoconjunctivitis of both eyes 12/14/2018  . Adverse food reaction 12/14/2018  . Pregnant and not yet delivered in second trimester 12/14/2018    Past Surgical History:  Procedure Laterality Date  . WISDOM TOOTH EXTRACTION      OB History    Gravida  2   Para  0   Term  0   Preterm  0   AB  1   Living  0     SAB  1   IAB  0   Ectopic  0   Multiple  0   Live Births  0            Home Medications    Prior to Admission medications   Medication Sig Start Date End Date Taking? Authorizing Provider  ondansetron (ZOFRAN ODT) 4 MG disintegrating tablet Take 1 tablet (4 mg total) by mouth every 8 (eight) hours as needed for nausea  or vomiting. 12/26/20  Yes Ivette Loyal, NP  albuterol (PROVENTIL HFA;VENTOLIN HFA) 108 (90 Base) MCG/ACT inhaler Inhale 2 puffs into the lungs every 4 (four) hours as needed for wheezing or shortness of breath. 12/14/18   Ellamae Sia, DO  AUVI-Q 0.3 MG/0.3ML SOAJ injection Inject 0.3 mLs (0.3 mg total) into the muscle as needed for anaphylaxis. 12/14/18   Ellamae Sia, DO  cetirizine (ZYRTEC) 10 MG tablet Take 10 mg by mouth daily.    [provider]  fluticasone (FLONASE) 50 MCG/ACT nasal spray Place 1 spray into both nostrils daily.    [provider]  ibuprofen (ADVIL) 600 MG tablet Take 1 tablet (600 mg total) by mouth every 6 (six) hours as needed. 05/29/19   Myna Hidalgo, DO  NIFEdipine (ADALAT CC) 60 MG 24 hr tablet Take 1 tablet (60 mg total) by mouth daily. 05/29/19   Myna Hidalgo, DO  triamcinolone cream (KENALOG) 0.1 % Apply 1 application topically daily as needed (Dry skin).  01/23/18   [provider]    Family History Family History  Problem Relation Age of Onset  . Hypertension Mother   . Allergy (severe) Mother  Grass  . Allergic rhinitis Neg Hx   . Angioedema Neg Hx   . Asthma Neg Hx   . Atopy Neg Hx   . Eczema Neg Hx   . Immunodeficiency Neg Hx   . Urticaria Neg Hx     Social History Social History   Tobacco Use  . Smoking status: Never Smoker  . Smokeless tobacco: Never Used  Vaping Use  . Vaping Use: Never used  Substance Use Topics  . Alcohol use: Not Currently    Comment: not in pregnancy  . Drug use: Never     Allergies   Other, Shellfish allergy, and Iodinated diagnostic agents   Review of Systems Review of Systems  Neurological: Positive for headaches.     Physical Exam Triage Vital Signs ED Triage Vitals  Enc Vitals Group     BP 12/26/20 1838 123/79     Pulse Rate 12/26/20 1838 87     Resp 12/26/20 1838 16     Temp 12/26/20 1838 98.3 F (36.8 C)     Temp Source 12/26/20 1838 Oral     SpO2 12/26/20  1838 97 %     Weight --      Height --      Head Circumference --      Peak Flow --      Pain Score 12/26/20 1834 4     Pain Loc --      Pain Edu? --      Excl. in GC? --    No data found.  Updated Vital Signs BP 123/79 (BP Location: Right Arm)   Pulse 87   Temp 98.3 F (36.8 C) (Oral)   Resp 16   SpO2 97%   Breastfeeding No Comment: irregular   Physical Exam Vitals and nursing note reviewed.  Constitutional:      General: She is not in acute distress.    Appearance: Normal appearance. She is not ill-appearing, toxic-appearing or diaphoretic.  HENT:     Head: Normocephalic and atraumatic.     Mouth/Throat:     Mouth: Mucous membranes are moist.     Pharynx: Oropharynx is clear.  Eyes:     General: No visual field deficit.    Extraocular Movements: Extraocular movements intact.     Conjunctiva/sclera: Conjunctivae normal.     Pupils: Pupils are equal, round, and reactive to light.  Cardiovascular:     Rate and Rhythm: Normal rate.     Pulses: Normal pulses.     Heart sounds: Normal heart sounds.  Pulmonary:     Effort: Pulmonary effort is normal.     Breath sounds: Normal breath sounds.  Abdominal:     General: Abdomen is flat.     Palpations: Abdomen is soft.  Musculoskeletal:        General: Normal range of motion.     Cervical back: Normal range of motion. No rigidity.  Skin:    General: Skin is warm and dry.  Neurological:     General: No focal deficit present.     Mental Status: She is alert and oriented to person, place, and time.     GCS: GCS eye subscore is 4. GCS verbal subscore is 5. GCS motor subscore is 6.     Cranial Nerves: No cranial nerve deficit, dysarthria or facial asymmetry.     Sensory: No sensory deficit.     Motor: No weakness.     Coordination: Coordination normal.     Gait: Gait normal.  Psychiatric:        Mood and Affect: Mood normal.        Speech: Speech normal.        Behavior: Behavior normal.      UC Treatments /  Results  Labs (all labs ordered are listed, but only abnormal results are displayed) Labs Reviewed - No data to display  EKG   Radiology No results found.  Procedures Procedures (including critical care time)  Medications Ordered in UC Medications  ketorolac (TORADOL) 30 MG/ML injection 30 mg (30 mg Intramuscular Given 12/26/20 1917)  dexamethasone (DECADRON) injection 10 mg (10 mg Intramuscular Given 12/26/20 1918)    Initial Impression / Assessment and Plan / UC Course  I have reviewed the triage vital signs and the nursing notes.  Pertinent labs & imaging results that were available during my care of the patient were reviewed by me and considered in my medical decision making (see chart for details).     Headache 1.Migraine cocktail (decadron and toradol) given in office 2.Encourage fluid intake and rest 3. Magnesium PO for migraine prevention 4. Follow up with PCP due to increasing frequency 5. Go to the ED for "worst HA of life"  Nausea/Vomiting 1.Encourage fluids 2. Zofran ODT for nausea/vomiting  Final Clinical Impressions(s) / UC Diagnoses   Final diagnoses:  Acute nonintractable headache, unspecified headache type  Non-intractable vomiting with nausea, unspecified vomiting type     Discharge Instructions     You received a migraine cocktail in the office.  You can take a benadryl once you get home.    Take the zofran as needed for nausea and vomitng.   Take magnesium 400mg  daily.   Follow up with your PCP for increased frequency of headaches.   Return or go to the Emergency Department if symptoms worsen or you develop the worst headache of your life.      ED Prescriptions    Medication Sig Dispense Auth. Provider   ondansetron (ZOFRAN ODT) 4 MG disintegrating tablet Take 1 tablet (4 mg total) by mouth every 8 (eight) hours as needed for nausea or vomiting. 20 tablet , NP     PDMP not reviewed this encounter.   Ivette Loyal,  NP 12/26/20 1927

## 2020-12-26 NOTE — Discharge Instructions (Addendum)
You received a migraine cocktail in the office.  You can take a benadryl once you get home.    Take the zofran as needed for nausea and vomitng.   Take magnesium 400mg  daily.   Follow up with your PCP for increased frequency of headaches.   Return or go to the Emergency Department if symptoms worsen or you develop the worst headache of your life.

## 2020-12-26 NOTE — ED Triage Notes (Signed)
Pt reports she had a migraine that started last night and today. Pt reports new Sx's of feeling dizzy and profuse sweating which is new.Pt also reported vomiting x2.

## 2021-03-30 DIAGNOSIS — R059 Cough, unspecified: Secondary | ICD-10-CM | POA: Diagnosis not present

## 2021-03-30 DIAGNOSIS — J209 Acute bronchitis, unspecified: Secondary | ICD-10-CM | POA: Diagnosis not present

## 2021-04-08 ENCOUNTER — Ambulatory Visit: Admit: 2021-04-08 | Discharge status: Against Medical Advice | Payer: 59

## 2021-06-19 ENCOUNTER — Ambulatory Visit: Payer: 59

## 2021-06-29 DIAGNOSIS — R519 Headache, unspecified: Secondary | ICD-10-CM | POA: Diagnosis not present

## 2021-06-29 DIAGNOSIS — G43009 Migraine without aura, not intractable, without status migrainosus: Secondary | ICD-10-CM | POA: Diagnosis not present

## 2021-06-30 ENCOUNTER — Other Ambulatory Visit: Payer: Self-pay | Admitting: Physician Assistant

## 2021-06-30 ENCOUNTER — Other Ambulatory Visit (HOSPITAL_COMMUNITY): Payer: Self-pay | Admitting: Physician Assistant

## 2021-06-30 DIAGNOSIS — R519 Headache, unspecified: Secondary | ICD-10-CM

## 2021-07-08 ENCOUNTER — Other Ambulatory Visit (HOSPITAL_COMMUNITY): Payer: Self-pay | Admitting: Family Medicine

## 2021-07-08 ENCOUNTER — Ambulatory Visit (HOSPITAL_COMMUNITY)
Admission: RE | Admit: 2021-07-08 | Discharge: 2021-07-08 | Disposition: A | Discharge status: Home / Self Care | Payer: 59 | Source: Ambulatory Visit | Attending: Physician Assistant | Admitting: Physician Assistant

## 2021-07-08 ENCOUNTER — Ambulatory Visit (HOSPITAL_COMMUNITY)
Admission: RE | Admit: 2021-07-08 | Discharge: 2021-07-08 | Disposition: A | Discharge status: Home / Self Care | Payer: 59 | Source: Ambulatory Visit | Attending: Family Medicine | Admitting: Family Medicine

## 2021-07-08 ENCOUNTER — Other Ambulatory Visit: Payer: Self-pay

## 2021-07-08 ENCOUNTER — Other Ambulatory Visit: Payer: Self-pay | Admitting: Family Medicine

## 2021-07-08 DIAGNOSIS — G43909 Migraine, unspecified, not intractable, without status migrainosus: Secondary | ICD-10-CM

## 2021-07-08 DIAGNOSIS — R519 Headache, unspecified: Secondary | ICD-10-CM | POA: Insufficient documentation

## 2021-07-08 MED ORDER — GADOBUTROL 1 MMOL/ML IV SOLN
10.0000 mL | Freq: Once | INTRAVENOUS | Status: AC | PRN
  Administered 2021-07-08: 10 mL via INTRAVENOUS

## 2021-07-17 ENCOUNTER — Telehealth: Payer: Self-pay | Admitting: Internal Medicine

## 2021-07-17 NOTE — Telephone Encounter (Signed)
Scheduled appt per 10/6 referral. Pt is aware of appt date and time.  

## 2021-07-27 ENCOUNTER — Ambulatory Visit: Payer: 59 | Admitting: Internal Medicine

## 2021-07-28 ENCOUNTER — Inpatient Hospital Stay: Payer: 59 | Attending: Internal Medicine | Admitting: Internal Medicine

## 2021-07-28 ENCOUNTER — Other Ambulatory Visit: Payer: Self-pay

## 2021-07-28 VITALS — BP 134/94 | HR 68 | Temp 97.4°F | Resp 19 | Ht 62.5 in | Wt 213.0 lb

## 2021-07-28 DIAGNOSIS — G932 Benign intracranial hypertension: Secondary | ICD-10-CM | POA: Diagnosis not present

## 2021-07-28 NOTE — Progress Notes (Signed)
[facility] at [facility] [address]  [place], [place] [address] [phone]   New Patient Evaluation  Date of Service: 07/28/21 Patient Name: Jane Ellison Patient MRN: [id number] Patient DOB: July 16, 1991 Provider: Henreitta Leber, MD  Identifying Statement:  Jane Ellison is a 30 y.o. female with Intracranial hypertension - Plan: DG FL GUIDED LUMBAR PUNCTURE who presents for initial consultation and evaluation Referring Provider: Wilfrid Lund, PA [address] [place],  [place] [address]  History of Present Illness: The patient's records from the referring physician were obtained and reviewed and the patient interviewed to confirm this HPI.  Jane Ellison presents to clinic today for evaluation of headaches and abnormal MRI findings.  She describes years long history of migraine headaches; described as unilateral pressure/pounding, associated with photophobia and nausea, most often in the morning.  Frequency is 1-3x per week, increased in frequency and severity in recent months.  She has been taking imitrex as needed, and last month was started on Nurtec by her PCP.  This has not helped thus far.  Between headaches, she experiences lingering milder pain or pain in "both temples" as of late.  No visual issues or exacerbation of pain with bearing down.    Medications: Current Outpatient Medications on File Prior to Visit  Medication Sig Dispense Refill   fluticasone (FLONASE) 50 MCG/ACT nasal spray Place 1 spray into both nostrils daily.     ibuprofen (ADVIL) 600 MG tablet Take 1 tablet (600 mg total) by mouth every 6 (six) hours as needed. 30 tablet 0   loratadine (CLARITIN) 10 MG tablet Take 10 mg by mouth daily.     NIFEdipine (ADALAT CC) 60 MG 24 hr tablet Take 1 tablet (60 mg total) by mouth daily. 30 tablet 1   ondansetron (ZOFRAN ODT) 4 MG disintegrating tablet Take 1 tablet (4 mg total) by mouth every 8 (eight) hours as needed for  nausea or vomiting. 20 tablet 0   SUMAtriptan (IMITREX) 100 MG tablet Take 1 tablet by mouth See admin instructions. Take 1-2 and ok to repeat 2 hours.     triamcinolone cream (KENALOG) 0.1 % Apply 1 application topically daily as needed (Dry skin).   6   albuterol (PROVENTIL HFA;VENTOLIN HFA) 108 (90 Base) MCG/ACT inhaler Inhale 2 puffs into the lungs every 4 (four) hours as needed for wheezing or shortness of breath. (Patient not taking: Reported on 07/28/2021) 18 g 1   AUVI-Q 0.3 MG/0.3ML SOAJ injection Inject 0.3 mLs (0.3 mg total) into the muscle as needed for anaphylaxis. (Patient not taking: Reported on 07/28/2021) 2 Device 1   NURTEC 75 MG TBDP Take 1 tablet by mouth daily as needed. (Patient not taking: Reported on 07/28/2021)     No current facility-administered medications on file prior to visit.    Allergies:  Allergies  Allergen Reactions   Other Anaphylaxis    Tree Nuts Only   Shellfish Allergy Anaphylaxis   Pollen Extract Other (See Comments)    Runny nose   Iodinated Diagnostic Agents    Past Medical History:  Past Medical History:  Diagnosis Date   Angio-edema    Asthma    rarely uses inhaler   Eczema    Head ache    Hyperlipidemia    no meds, diet controlled   Seasonal allergies    Past Surgical History:  Past Surgical History:  Procedure Laterality Date   WISDOM TOOTH EXTRACTION     Social History:  Social History   Socioeconomic History   Marital status: Married    Spouse name: Delrus   Number of children: Not on file   Years of education: Not on file   Highest education level: Not on file  Occupational History   Not on file  Tobacco Use   Smoking status: Never   Smokeless tobacco: Never  Vaping Use   Vaping Use: Never used  Substance and Sexual Activity   Alcohol use: Not Currently    Comment: not in pregnancy   Drug use: Never   Sexual activity: Yes    Birth control/protection: None    Comment: pregnant  Other Topics Concern   Not on  file  Social History Narrative   Not on file   Social Determinants of Health   Financial Resource Strain: Not on file  Food Insecurity: Not on file  Transportation Needs: Not on file  Physical Activity: Not on file  Stress: Not on file  Social Connections: Not on file  Intimate Partner Violence: Not on file   Family History:  Family History  Problem Relation Age of Onset   Hypertension Mother    Allergy (severe) Mother        Grass   Allergic rhinitis Neg Hx    Angioedema Neg Hx    Asthma Neg Hx    Atopy Neg Hx    Eczema Neg Hx    Immunodeficiency Neg Hx    Urticaria Neg Hx     Review of Systems: Constitutional: Doesn't report fevers, chills or abnormal weight loss Eyes: Doesn't report blurriness of vision Ears, nose, mouth, throat, and face: Doesn't report sore throat Respiratory: Doesn't report cough, dyspnea or wheezes Cardiovascular: Doesn't report palpitation, chest discomfort  Gastrointestinal:  Doesn't report nausea, constipation, diarrhea GU: Doesn't report incontinence Skin: Doesn't report skin rashes Neurological: Per HPI Musculoskeletal: Doesn't report joint pain Behavioral/Psych: Doesn't report anxiety  Physical Exam: Vitals:   07/28/21 1126  BP: (!) 134/94  Pulse: 68  Resp: 19  Temp: (!) 97.4 F (36.3 C)  SpO2: 100%   KPS: 90. General: Alert, cooperative, pleasant, in no acute distress Head: Normal EENT: No conjunctival injection or scleral icterus.  Lungs: Resp effort normal Cardiac: Regular rate Abdomen: Non-distended abdomen Skin: No rashes cyanosis or petechiae. Extremities: No clubbing or edema  Neurologic Exam: Mental Status: Awake, alert, attentive to examiner. Oriented to self and environment. Language is fluent with intact comprehension.  Cranial Nerves: Visual acuity is grossly normal. Visual fields are full. Extra-ocular movements intact. No ptosis. Face is symmetric Motor: Tone and bulk are normal. Power is full in both arms  and legs. Reflexes are symmetric, no pathologic reflexes present.  Sensory: Intact to light touch Gait: Normal.   Labs: I have reviewed the data as listed    Component Value Date/Time   NA 137 05/28/2019 0915   K 3.9 05/28/2019 0915   CL 105 05/28/2019 0915   CO2 23 05/28/2019 0915   GLUCOSE 78 05/28/2019 0915   BUN <5 (L) 05/28/2019 0915   CREATININE 0.69 05/28/2019 0915   CALCIUM 8.6 (L) 05/28/2019 0915   PROT 6.5 05/28/2019 0915   ALBUMIN 2.7 (L) 05/28/2019 0915   AST 30 05/28/2019 0915   ALT 20 05/28/2019 0915   ALKPHOS 217 (H) 05/28/2019 0915   BILITOT 0.3 05/28/2019 0915   GFRNONAA >60 05/28/2019 0915   GFRAA >60 05/28/2019 0915   Lab Results  Component Value Date   WBC 10.1 05/27/2019   NEUTROABS  1.9 12/13/2008   HGB 11.3 (L) 05/27/2019   HCT 31.9 (L) 05/27/2019   MCV 85.8 05/27/2019   PLT 216 05/27/2019    Imaging:  CT HEAD WO CONTRAST ( )  Result Date: 07/08/2021 CLINICAL DATA:  Migraine headache possible abnormal MRI EXAM: CT HEAD WITHOUT CONTRAST TECHNIQUE: Contiguous axial images were obtained from the base of the skull through the vertex without intravenous contrast. COMPARISON:  MRI 07/08/2021 FINDINGS: Brain: No evidence of acute infarction, hemorrhage, hydrocephalus, extra-axial collection or mass lesion/mass effect. Mild inferior extension of cerebellar tonsils below foramen magnum. Partially empty sella. Vascular: No hyperdense vessel or unexpected calcification. Skull: Normal. Negative for fracture or focal lesion. Sinuses/Orbits: No acute finding. Other: None IMPRESSION: 1. Mild inferior extension of cerebellar tonsils below foramen magnum. Partially empty sella as noted on MRI. 2. No CT correlate for heterogeneous marrow signal on CT, no lytic or sclerotic lesions are seen. Electronically Signed   By: Jasmine Pang M.D.   On: 07/08/2021 17:01   MR BRAIN W WO CONTRAST  Result Date: 07/08/2021 CLINICAL DATA:  New onset headache. Additional history  provided by scanning technologist: Patient reports new onset headaches, associated nausea and vomiting. EXAM: MRI HEAD WITHOUT AND WITH CONTRAST TECHNIQUE: Multiplanar, multiecho pulse sequences of the brain and surrounding structures were obtained without and with intravenous contrast. CONTRAST:  37mL GADAVIST GADOBUTROL 1 MMOL/ML IV SOLN COMPARISON:  No pertinent prior exams available for comparison. FINDINGS: Brain: Cerebral volume is normal. 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 The cerebellar tonsils maintain a normal rounded morphology. Partially empty sella turcica. No cortical encephalomalacia. No significant cerebral white matter disease. There is no acute infarct. No evidence of an intracranial mass. No chronic intracranial blood products. No extra-axial fluid collection. No midline shift. No pathologic intracranial enhancement. Vascular: Maintained flow voids within the proximal large arterial vessels. Skull and upper cervical spine: Multifocal abnormal T1 hypointense marrow signal within the calvarium, most notably within the bilateral parietal and occipital bones. Sinuses/Orbits: Visualized orbits show no acute finding. Mild mucosal thickening within the bilateral ethmoid, bilateral sphenoid and bilateral maxillary sinuses. Superimposed small right maxillary sinus mucous retention cyst. Other: Trace fluid within the right mastoid air cells. Impression #3 will be called to the ordering clinician or representative by the Radiologist Assistant, and communication documented in the PACS or [employer]. 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 This meets measurement criteria for a Chiari 1 malformation. However, the cerebellar tonsils maintain a normal rounded morphology. Additionally,  there is a partially empty sella turcica, and this constellation of findings (cerebellar tonsillar ectopia and partially empty sella) can be seen in the setting of idiopathic intracranial hypertension (pseudotumor cerebri). Otherwise unremarkable MRI appearance of the brain. Multifocal abnormal T1 hypointense marrow signal within the calvarium, most notably within the bilateral parietal and occipital bones. This could reflect patchy red marrow reconversion (such as would be seen in the setting of chronic anemia). However, more worrisome etiologies (such as osseous metastatic disease) cannot be excluded. A noncontrast head CT is recommended for initial further evaluation. Electronically Signed   By: Jackey Loge D.O.   On: 07/08/2021 09:47     Assessment/Plan Intracranial hypertension - Plan: DG FL GUIDED LUMBAR PUNCTURE  Clinical history and particular MRI findings arouse suspicion for idiopathic intracranial hypertension (IIH).  She does not wake up every morning with headaches, and does not experience visual obscurations.  We recommended further evaluation with lumbar puncture; large volume tap with opening pressure and routine studies.  This can help clarify role of IIH vs traditional migraine w/o aura.  For now, she should continue Nurtec 75mg  QOD and Imitrex 100mg  PRN acute headache.   We spent twenty additional minutes teaching regarding the natural history, biology, and historical experience in the treatment of neurologic problems.   We appreciate the opportunity to participate in the care of Ahjanae Cassel.  She should return for evaluation following LP.    All questions were answered. The patient knows to call the clinic with any problems, questions or concerns. No barriers to learning were detected.  The total time spent in the encounter was 45 minutes and more than 50% was on counseling and review of test results   , MD Medical Director of Neuro-Oncology [facility] 07/28/21 1:20 PM

## 2021-07-29 ENCOUNTER — Telehealth: Payer: Self-pay | Admitting: Internal Medicine

## 2021-07-29 NOTE — Telephone Encounter (Signed)
Scheduled appt per 10/18 los. Pt is aware of appt date and time.

## 2021-07-30 ENCOUNTER — Telehealth: Payer: Self-pay | Admitting: *Deleted

## 2021-07-30 NOTE — Telephone Encounter (Signed)
Completed [employer] form on behalf of patient with Dr Barbaraann Cao.  Form faxed to Sandford Craze [phone].  Copy also provided to patient.

## 2021-08-04 ENCOUNTER — Other Ambulatory Visit: Payer: Self-pay

## 2021-08-04 ENCOUNTER — Ambulatory Visit (HOSPITAL_COMMUNITY)
Admission: RE | Admit: 2021-08-04 | Discharge: 2021-08-04 | Disposition: A | Discharge status: Home / Self Care | Payer: 59 | Source: Ambulatory Visit | Attending: Internal Medicine | Admitting: Internal Medicine

## 2021-08-04 DIAGNOSIS — G932 Benign intracranial hypertension: Secondary | ICD-10-CM | POA: Diagnosis not present

## 2021-08-04 DIAGNOSIS — R519 Headache, unspecified: Secondary | ICD-10-CM | POA: Diagnosis not present

## 2021-08-04 LAB — CSF CELL COUNT WITH DIFFERENTIAL
RBC Count, CSF: 4000 /mm3 — ABNORMAL HIGH
Tube #: 3
WBC, CSF: 5 /mm3 (ref 0–5)

## 2021-08-04 LAB — PROTEIN, CSF: Total  Protein, CSF: 17 mg/dL (ref 15–45)

## 2021-08-04 LAB — GLUCOSE, CSF: Glucose, CSF: 56 mg/dL (ref 40–70)

## 2021-08-04 MED ORDER — LIDOCAINE HCL (PF) 1 % IJ SOLN
5.0000 mL | Freq: Once | INTRAMUSCULAR | Status: AC
  Administered 2021-08-04: 3 mL via INTRADERMAL

## 2021-08-06 ENCOUNTER — Inpatient Hospital Stay (HOSPITAL_BASED_OUTPATIENT_CLINIC_OR_DEPARTMENT_OTHER): Payer: 59 | Admitting: Internal Medicine

## 2021-08-06 ENCOUNTER — Other Ambulatory Visit: Payer: Self-pay

## 2021-08-06 VITALS — BP 123/87 | HR 88 | Temp 97.7°F | Resp 20 | Wt 212.4 lb

## 2021-08-06 DIAGNOSIS — G932 Benign intracranial hypertension: Secondary | ICD-10-CM

## 2021-08-06 MED ORDER — ACETAZOLAMIDE 250 MG PO TABS: 500.0000 mg | ORAL_TABLET | Freq: Two times a day (BID) | ORAL | 2 refills | Status: DC

## 2021-08-06 NOTE — Progress Notes (Signed)
[facility] at [facility] [address]  [place], [place] [address] [phone]   Interval Evaluation  Date of Service: 08/06/21 Patient Name: Jane Ellison Patient MRN: [id number] Patient DOB: 08-Sep-1991 Provider: Henreitta Leber, MD  Identifying Statement:  Jane Ellison is a 30 y.o. female with IIH (idiopathic intracranial hypertension) - Plan: Ambulatory referral to Ophthalmology  Idiopathic intracranial hypertension   Interval History: Jane Ellison presents today after recent lumbar puncture.  She continues to experience headaches as prior, no change in the syndrome.  She has a headache today.  Tolerated procedure well, didn't notice significant change in symptoms with spinal fluid drawn off.  Continues working, caring for daughter.    (H+P) Patient presents to clinic today for evaluation of headaches and abnormal MRI findings.  She describes years long history of migraine headaches; described as unilateral pressure/pounding, associated with photophobia and nausea, most often in the morning.  Frequency is 1-3x per week, increased in frequency and severity in recent months.  She has been taking imitrex as needed, and last month was started on Nurtec by her PCP.  This has not helped thus far.  Between headaches, she experiences lingering milder pain or pain in "both temples" as of late.  No visual issues or exacerbation of pain with bearing down.    Medications: Current Outpatient Medications on File Prior to Visit  Medication Sig Dispense Refill   albuterol (PROVENTIL HFA;VENTOLIN HFA) 108 (90 Base) MCG/ACT inhaler Inhale 2 puffs into the lungs every 4 (four) hours as needed for wheezing or shortness of breath. (Patient not taking: Reported on 07/28/2021) 18 g 1   AUVI-Q 0.3 MG/0.3ML SOAJ injection Inject 0.3 mLs (0.3 mg total) into the muscle as needed for anaphylaxis. (Patient not taking: Reported on 07/28/2021) 2 Device 1   fluticasone (FLONASE)  50 MCG/ACT nasal spray Place 1 spray into both nostrils daily.     ibuprofen (ADVIL) 600 MG tablet Take 1 tablet (600 mg total) by mouth every 6 (six) hours as needed. 30 tablet 0   loratadine (CLARITIN) 10 MG tablet Take 10 mg by mouth daily.     NIFEdipine (ADALAT CC) 60 MG 24 hr tablet Take 1 tablet (60 mg total) by mouth daily. 30 tablet 1   NURTEC 75 MG TBDP Take 1 tablet by mouth daily as needed. (Patient not taking: Reported on 07/28/2021)     ondansetron (ZOFRAN ODT) 4 MG disintegrating tablet Take 1 tablet (4 mg total) by mouth every 8 (eight) hours as needed for nausea or vomiting. 20 tablet 0   SUMAtriptan (IMITREX) 100 MG tablet Take 1 tablet by mouth See admin instructions. Take 1-2 and ok to repeat 2 hours.     triamcinolone cream (KENALOG) 0.1 % Apply 1 application topically daily as needed (Dry skin).   6   No current facility-administered medications on file prior to visit.    Allergies:  Allergies  Allergen Reactions   Other Anaphylaxis    Tree Nuts Only   Shellfish Allergy Anaphylaxis   Pollen Extract Other (See Comments)    Runny nose   Iodinated Diagnostic Agents    Past Medical History:  Past Medical History:  Diagnosis Date   Angio-edema    Asthma    rarely uses inhaler   Eczema    Head ache    Hyperlipidemia    no meds, diet controlled   Seasonal allergies    Past Surgical History:  Past Surgical History:  Procedure Laterality Date  WISDOM TOOTH EXTRACTION     Social History:  Social History   Socioeconomic History   Marital status: Married    Spouse name: Delrus   Number of children: Not on file   Years of education: Not on file   Highest education level: Not on file  Occupational History   Not on file  Tobacco Use   Smoking status: Never   Smokeless tobacco: Never  Vaping Use   Vaping Use: Never used  Substance and Sexual Activity   Alcohol use: Not Currently    Comment: not in pregnancy   Drug use: Never   Sexual activity: Yes     Birth control/protection: None    Comment: pregnant  Other Topics Concern   Not on file  Social History Narrative   Not on file   Social Determinants of Health   Financial Resource Strain: Not on file  Food Insecurity: Not on file  Transportation Needs: Not on file  Physical Activity: Not on file  Stress: Not on file  Social Connections: Not on file  Intimate Partner Violence: Not on file   Family History:  Family History  Problem Relation Age of Onset   Hypertension Mother    Allergy (severe) Mother        Grass   Allergic rhinitis Neg Hx    Angioedema Neg Hx    Asthma Neg Hx    Atopy Neg Hx    Eczema Neg Hx    Immunodeficiency Neg Hx    Urticaria Neg Hx     Review of Systems: Constitutional: Doesn't report fevers, chills or abnormal weight loss Eyes: Doesn't report blurriness of vision Ears, nose, mouth, throat, and face: Doesn't report sore throat Respiratory: Doesn't report cough, dyspnea or wheezes Cardiovascular: Doesn't report palpitation, chest discomfort  Gastrointestinal:  Doesn't report nausea, constipation, diarrhea GU: Doesn't report incontinence Skin: Doesn't report skin rashes Neurological: Per HPI Musculoskeletal: Doesn't report joint pain Behavioral/Psych: Doesn't report anxiety  Physical Exam: Vitals:   08/06/21 1119  BP: 123/87  Pulse: 88  Resp: 20  Temp: 97.7 F (36.5 C)  SpO2: 100%   KPS: 90. General: Alert, cooperative, pleasant, in no acute distress Head: Normal EENT: No conjunctival injection or scleral icterus.  Lungs: Resp effort normal Cardiac: Regular rate Abdomen: Non-distended abdomen Skin: No rashes cyanosis or petechiae. Extremities: No clubbing or edema  Neurologic Exam: Mental Status: Awake, alert, attentive to examiner. Oriented to self and environment. Language is fluent with intact comprehension.  Cranial Nerves: Visual acuity is grossly normal. Visual fields are full. Extra-ocular movements intact. No ptosis.  Face is symmetric Motor: Tone and bulk are normal. Power is full in both arms and legs. Reflexes are symmetric, no pathologic reflexes present.  Sensory: Intact to light touch Gait: Normal.   Labs: I have reviewed the data as listed    Component Value Date/Time   NA 137 05/28/2019 0915   K 3.9 05/28/2019 0915   CL 105 05/28/2019 0915   CO2 23 05/28/2019 0915   GLUCOSE 78 05/28/2019 0915   BUN <5 (L) 05/28/2019 0915   CREATININE 0.69 05/28/2019 0915   CALCIUM 8.6 (L) 05/28/2019 0915   PROT 6.5 05/28/2019 0915   ALBUMIN 2.7 (L) 05/28/2019 0915   AST 30 05/28/2019 0915   ALT 20 05/28/2019 0915   ALKPHOS 217 (H) 05/28/2019 0915   BILITOT 0.3 05/28/2019 0915   GFRNONAA >60 05/28/2019 0915   GFRAA >60 05/28/2019 0915   Lab Results  Component Value Date  WBC 10.1 05/27/2019   NEUTROABS 1.9 12/13/2008   HGB 11.3 (L) 05/27/2019   HCT 31.9 (L) 05/27/2019   MCV 85.8 05/27/2019   PLT 216 05/27/2019    Imaging:  CT HEAD WO CONTRAST ( )  Result Date: 07/08/2021 CLINICAL DATA:  Migraine headache possible abnormal MRI EXAM: CT HEAD WITHOUT CONTRAST TECHNIQUE: Contiguous axial images were obtained from the base of the skull through the vertex without intravenous contrast. COMPARISON:  MRI 07/08/2021 FINDINGS: Brain: No evidence of acute infarction, hemorrhage, hydrocephalus, extra-axial collection or mass lesion/mass effect. Mild inferior extension of cerebellar tonsils below foramen magnum. Partially empty sella. Vascular: No hyperdense vessel or unexpected calcification. Skull: Normal. Negative for fracture or focal lesion. Sinuses/Orbits: No acute finding. Other: None IMPRESSION: 1. Mild inferior extension of cerebellar tonsils below foramen magnum. Partially empty sella as noted on MRI. 2. No CT correlate for heterogeneous marrow signal on CT, no lytic or sclerotic lesions are seen. Electronically Signed   By: Jasmine Pang M.D.   On: 07/08/2021 17:01   MR BRAIN W WO  CONTRAST  Result Date: 07/08/2021 CLINICAL DATA:  New onset headache. Additional history provided by scanning technologist: Patient reports new onset headaches, associated nausea and vomiting. EXAM: MRI HEAD WITHOUT AND WITH CONTRAST TECHNIQUE: Multiplanar, multiecho pulse sequences of the brain and surrounding structures were obtained without and with intravenous contrast. CONTRAST:  38mL GADAVIST GADOBUTROL 1 MMOL/ML IV SOLN COMPARISON:  No pertinent prior exams available for comparison. FINDINGS: Brain: Cerebral volume is normal. 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 The cerebellar tonsils maintain a normal rounded morphology. Partially empty sella turcica. No cortical encephalomalacia. No significant cerebral white matter disease. There is no acute infarct. No evidence of an intracranial mass. No chronic intracranial blood products. No extra-axial fluid collection. No midline shift. No pathologic intracranial enhancement. Vascular: Maintained flow voids within the proximal large arterial vessels. Skull and upper cervical spine: Multifocal abnormal T1 hypointense marrow signal within the calvarium, most notably within the bilateral parietal and occipital bones. Sinuses/Orbits: Visualized orbits show no acute finding. Mild mucosal thickening within the bilateral ethmoid, bilateral sphenoid and bilateral maxillary sinuses. Superimposed small right maxillary sinus mucous retention cyst. Other: Trace fluid within the right mastoid air cells. Impression #3 will be called to the ordering clinician or representative by the Radiologist Assistant, and communication documented in the PACS or [employer]. 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 This meets measurement criteria for a Chiari 1  malformation. However, the cerebellar tonsils maintain a normal rounded morphology. Additionally, there is a partially empty sella turcica, and this constellation of findings (cerebellar tonsillar ectopia and partially empty sella) can be seen in the setting of idiopathic intracranial hypertension (pseudotumor cerebri). Otherwise unremarkable MRI appearance of the brain. Multifocal abnormal T1 hypointense marrow signal within the calvarium, most notably within the bilateral parietal and occipital bones. This could reflect patchy red marrow reconversion (such as would be seen in the setting of chronic anemia). However, more worrisome etiologies (such as osseous metastatic disease) cannot be excluded. A noncontrast head CT is recommended for initial further evaluation. Electronically Signed   By: Jackey Loge D.O.   On: 07/08/2021 09:47   DG FL GUIDED LUMBAR PUNCTURE  Result Date: 08/04/2021 CLINICAL DATA:  Patient with history of headaches. Team concerned for idiopathic  intracranial hypertension. Request is for lumbar puncture EXAM: DIAGNOSTIC LUMBAR PUNCTURE UNDER FLUOROSCOPIC GUIDANCE COMPARISON:  None FLUOROSCOPY TIME:  Fluoroscopy Time: 36 second Radiation Exposure Index (if provided by the fluoroscopic device): 12.4 Number of Acquired Spot Images: 0 PROCEDURE: Informed consent was obtained from the patient prior to the procedure, including potential complications of headache, allergy, and pain. With the patient prone, the lower back was prepped with Betadine. 1% Lidocaine was used for local anesthesia. Lumbar puncture was performed at the L5-S1 level using a 20 gauge 5 inch needle with return of clear CSF with an opening pressure of 26 cm water. 18 ml of CSF were obtained for laboratory studies. CSF closing pressure of 13 cm water the patient tolerated the procedure well and there were no apparent complications. IMPRESSION: Technically successful lumbar puncture. Read by Anders Grant NP Electronically  Signed   By: Genevive Bi M.D.   On: 08/04/2021 09:32     Assessment/Plan IIH (idiopathic intracranial hypertension) - Plan: Ambulatory referral to Ophthalmology  Idiopathic intracranial hypertension  Jane Ellison is clinically stable today.  CSF demonstrated opening pressure of 26cm, which along with clinical and MRI findings is consistent with IIH as discussed last visit.  We recommended the following: -Trial of diamox, 500mg  BID to start.  We reviewed side effects including paresthesias -Referral to ophthalmology for evaluation of optic discs and visual fields  For now, she should also continue Nurtec 75mg  QOD and Imitrex 100mg  PRN acute headache.   We appreciate the opportunity to participate in the care of Jane Ellison.  She will return shortly to continue adjustments of medications for headache.    All questions were answered. The patient knows to call the clinic with any problems, questions or concerns. No barriers to learning were detected.  The total time spent in the encounter was 30 minutes and more than 50% was on counseling and review of test results   , MD Medical Director of Neuro-Oncology [facility] at [facility] 08/06/21 11:25 AM

## 2021-08-07 LAB — CSF CULTURE W GRAM STAIN: Culture: NO GROWTH

## 2021-08-27 DIAGNOSIS — H0520 Unspecified exophthalmos: Secondary | ICD-10-CM | POA: Diagnosis not present

## 2021-08-27 DIAGNOSIS — G932 Benign intracranial hypertension: Secondary | ICD-10-CM | POA: Diagnosis not present

## 2021-08-27 DIAGNOSIS — H4710 Unspecified papilledema: Secondary | ICD-10-CM | POA: Diagnosis not present

## 2021-10-19 DIAGNOSIS — Z01419 Encounter for gynecological examination (general) (routine) without abnormal findings: Secondary | ICD-10-CM | POA: Diagnosis not present

## 2021-10-19 DIAGNOSIS — Z30431 Encounter for routine checking of intrauterine contraceptive device: Secondary | ICD-10-CM | POA: Diagnosis not present

## 2021-10-19 DIAGNOSIS — F418 Other specified anxiety disorders: Secondary | ICD-10-CM | POA: Diagnosis not present

## 2021-10-19 DIAGNOSIS — R102 Pelvic and perineal pain: Secondary | ICD-10-CM | POA: Diagnosis not present

## 2021-11-05 DIAGNOSIS — R1011 Right upper quadrant pain: Secondary | ICD-10-CM | POA: Diagnosis not present

## 2021-11-06 ENCOUNTER — Other Ambulatory Visit: Payer: Self-pay | Admitting: Family Medicine

## 2021-11-06 DIAGNOSIS — R1011 Right upper quadrant pain: Secondary | ICD-10-CM

## 2021-11-15 ENCOUNTER — Other Ambulatory Visit: Payer: Self-pay | Admitting: Internal Medicine

## 2021-11-18 ENCOUNTER — Other Ambulatory Visit: Payer: Self-pay

## 2021-11-18 ENCOUNTER — Ambulatory Visit
Admission: RE | Admit: 2021-11-18 | Discharge: 2021-11-18 | Disposition: A | Discharge status: Home / Self Care | Payer: 59 | Source: Ambulatory Visit | Attending: Family Medicine | Admitting: Family Medicine

## 2021-11-18 DIAGNOSIS — K8020 Calculus of gallbladder without cholecystitis without obstruction: Secondary | ICD-10-CM | POA: Diagnosis not present

## 2021-11-18 DIAGNOSIS — R1011 Right upper quadrant pain: Secondary | ICD-10-CM | POA: Diagnosis not present

## 2021-11-20 DIAGNOSIS — H5213 Myopia, bilateral: Secondary | ICD-10-CM | POA: Diagnosis not present

## 2021-12-02 ENCOUNTER — Ambulatory Visit: Payer: Self-pay | Admitting: General Surgery

## 2021-12-02 DIAGNOSIS — K8020 Calculus of gallbladder without cholecystitis without obstruction: Secondary | ICD-10-CM

## 2021-12-02 DIAGNOSIS — F418 Other specified anxiety disorders: Secondary | ICD-10-CM | POA: Diagnosis not present

## 2021-12-04 NOTE — Progress Notes (Signed)
Subjective:    Jane Ellison - 31 y.o. female MRN [id number]  Date of birth: 07/03/1991  HPI  Jane Ellison is to establish care and annual physical exam.   Current issues and/or concerns: None.   ROS per HPI    Health Maintenance:  Health Maintenance Due  Topic Date Due   COVID-19 Vaccine (1) Never done   Hepatitis C Screening  Never done     Past Medical History: Patient Active Problem List   Diagnosis Date Noted   Idiopathic intracranial hypertension 07/28/2021   Indication for care in labor and delivery, antepartum 05/26/2019   Mild intermittent asthma without complication 76/16/0737   [redacted] weeks gestation of pregnancy 12/14/2018   Seasonal and perennial allergic rhinoconjunctivitis of both eyes 12/14/2018   Adverse food reaction 12/14/2018   Pregnant and not yet delivered in second trimester 12/14/2018    Social History   reports that she has never smoked. She has never used smokeless tobacco. She reports that she does not currently use alcohol. She reports that she does not use drugs.   Family History  family history includes Allergy (severe) in her mother; Hypertension in her mother.   Medications: reviewed and updated   Objective:   Physical Exam BP 115/76 (BP Location: Left Arm, Patient Position: Sitting, Cuff Size: Large)    Pulse 81    Temp 98.3 F (36.8 C)    Resp 18    Ht 5' 2.8" (1.595 m)    Wt 208 lb (94.3 kg)    SpO2 98%    BMI 37.09 kg/m   Physical Exam HENT:     Head: Normocephalic and atraumatic.     Right Ear: Tympanic membrane, ear canal and external ear normal.     Left Ear: Tympanic membrane, ear canal and external ear normal.     Mouth/Throat:     Mouth: Mucous membranes are moist.     Pharynx: Oropharynx is clear.  Eyes:     Extraocular Movements: Extraocular movements intact.     Conjunctiva/sclera: Conjunctivae normal.     Pupils: Pupils are equal, round, and reactive to light.  Cardiovascular:     Rate and Rhythm: Normal  rate and regular rhythm.     Pulses: Normal pulses.     Heart sounds: Normal heart sounds.  Pulmonary:     Effort: Pulmonary effort is normal.     Breath sounds: Normal breath sounds.  Chest:     Comments: Patient declined exam. Abdominal:     General: Bowel sounds are normal.     Palpations: Abdomen is soft.  Genitourinary:    Comments: Patient declined exam. Musculoskeletal:        General: Normal range of motion.     Cervical back: Normal range of motion and neck supple.  Skin:    General: Skin is warm and dry.     Capillary Refill: Capillary refill takes less than 2 seconds.  Neurological:     General: No focal deficit present.     Mental Status: She is alert and oriented to person, place, and time.  Psychiatric:        Mood and Affect: Mood normal.        Behavior: Behavior normal.       Assessment & Plan:  1. Encounter to establish care: 2. Annual physical exam: - Counseled on 150 minutes of exercise per week as tolerated, healthy eating (including decreased daily intake of saturated fats, cholesterol, added sugars, sodium), STI prevention, and routine  healthcare maintenance.  3. Screening for metabolic disorder: - DYN18+ZFPO to check kidney function, liver function, and electrolyte balance.  - CMP14+EGFR  4. Screening for deficiency anemia: - CBC to screen for anemia. - CBC  5. Diabetes mellitus screening: - Hemoglobin A1c to screen for pre-diabetes/diabetes. - Hemoglobin A1c  6. Screening cholesterol level: - Lipid panel to screen for high cholesterol.  - Lipid panel  7. Thyroid disorder screen: - TSH to check thyroid function.  - TSH  8. Pap smear for cervical cancer screening: 9. Routine screening for STI (sexually transmitted infection): - Patient declined, reports up to date.   10. Need for hepatitis C screening test: - Hepatitis C antibody to screen for hepatitis C.  - Hepatitis C Antibody     Patient was given clear instructions to go to  Emergency Department or return to medical center if symptoms don't improve, worsen, or new problems develop.The patient verbalized understanding.  I discussed the assessment and treatment plan with the patient. The patient was provided an opportunity to ask questions and all were answered. The patient agreed with the plan and demonstrated an understanding of the instructions.   The patient was advised to call back or seek an in-person evaluation if the symptoms worsen or if the condition fails to improve as anticipated.    Durene Fruits, NP 12/07/2021, 9:44 PM Primary Care at [facility]

## 2021-12-07 ENCOUNTER — Ambulatory Visit (INDEPENDENT_AMBULATORY_CARE_PROVIDER_SITE_OTHER): Payer: 59 | Admitting: Family

## 2021-12-07 ENCOUNTER — Encounter: Payer: Self-pay | Admitting: Family

## 2021-12-07 ENCOUNTER — Other Ambulatory Visit: Payer: Self-pay

## 2021-12-07 VITALS — BP 115/76 | HR 81 | Temp 98.3°F | Resp 18 | Ht 62.8 in | Wt 208.0 lb

## 2021-12-07 DIAGNOSIS — Z13228 Encounter for screening for other metabolic disorders: Secondary | ICD-10-CM | POA: Diagnosis not present

## 2021-12-07 DIAGNOSIS — Z113 Encounter for screening for infections with a predominantly sexual mode of transmission: Secondary | ICD-10-CM

## 2021-12-07 DIAGNOSIS — Z130 Encounter for screening for diseases of the blood and blood-forming organs and certain disorders involving the immune mechanism: Secondary | ICD-10-CM | POA: Diagnosis not present

## 2021-12-07 DIAGNOSIS — Z0000 Encounter for general adult medical examination without abnormal findings: Secondary | ICD-10-CM

## 2021-12-07 DIAGNOSIS — Z7689 Persons encountering health services in other specified circumstances: Secondary | ICD-10-CM

## 2021-12-07 DIAGNOSIS — Z124 Encounter for screening for malignant neoplasm of cervix: Secondary | ICD-10-CM

## 2021-12-07 DIAGNOSIS — Z1329 Encounter for screening for other suspected endocrine disorder: Secondary | ICD-10-CM | POA: Diagnosis not present

## 2021-12-07 DIAGNOSIS — Z1159 Encounter for screening for other viral diseases: Secondary | ICD-10-CM

## 2021-12-07 DIAGNOSIS — Z13220 Encounter for screening for lipoid disorders: Secondary | ICD-10-CM

## 2021-12-07 DIAGNOSIS — Z131 Encounter for screening for diabetes mellitus: Secondary | ICD-10-CM | POA: Diagnosis not present

## 2021-12-07 NOTE — Progress Notes (Signed)
Pt presents to establish care and annual physical exam, declined pap completed w/[facility] on 10/19/21

## 2021-12-07 NOTE — Patient Instructions (Addendum)
Thank you for choosing Primary Care at [facility] for your medical home!    Jane Ellison was seen by Camillia Herter, NP today.   Cephus Slater Raigoza's primary care provider is Durene Fruits, NP.   For the best care possible,  you should try to see Durene Fruits, NP whenever you come to clinic.   We look forward to seeing you again soon!  If you have any questions about your visit today,  please call us at [phone]  Or feel free to reach your provider via [employer].    Preventive Care 30-54 Years Old, Female Preventive care refers to lifestyle choices and visits with your health care provider that can promote health and wellness. Preventive care visits are also called wellness exams. What can I expect for my preventive care visit? Counseling During your preventive care visit, your health care provider may ask about your: Medical history, including: Past medical problems. Family medical history. Pregnancy history. Current health, including: Menstrual cycle. Method of birth control. Emotional well-being. Home life and relationship well-being. Sexual activity and sexual health. Lifestyle, including: Alcohol, nicotine or tobacco, and drug use. Access to firearms. Diet, exercise, and sleep habits. Work and work Statistician. Sunscreen use. Safety issues such as seatbelt and bike helmet use. Physical exam Your health care provider may check your: Height and weight. These may be used to calculate your BMI (body mass index). BMI is a measurement that tells if you are at a healthy weight. Waist circumference. This measures the distance around your waistline. This measurement also tells if you are at a healthy weight and may help predict your risk of certain diseases, such as type 2 diabetes and high blood pressure. Heart rate and blood pressure. Body temperature. Skin for abnormal spots. What immunizations do I need? Vaccines are usually given at various ages, according to a  schedule. Your health care provider will recommend vaccines for you based on your age, medical history, and lifestyle or other factors, such as travel or where you work. What tests do I need? Screening Your health care provider may recommend screening tests for certain conditions. This may include: Pelvic exam and Pap test. Lipid and cholesterol levels. Diabetes screening. This is done by checking your blood sugar (glucose) after you have not eaten for a while (fasting). Hepatitis B test. Hepatitis C test. HIV (human immunodeficiency virus) test. STI (sexually transmitted infection) testing, if you are at risk. BRCA-related cancer screening. This may be done if you have a family history of breast, ovarian, tubal, or peritoneal cancers. Talk with your health care provider about your test results, treatment options, and if necessary, the need for more tests. Follow these instructions at home: Eating and drinking  Eat a healthy diet that includes fresh fruits and vegetables, whole grains, lean protein, and low-fat dairy products. Take vitamin and mineral supplements as recommended by your health care provider. Do not drink alcohol if: Your health care provider tells you not to drink. You are pregnant, may be pregnant, or are planning to become pregnant. If you drink alcohol: Limit how much you have to 0-1 drink a day. Know how much alcohol is in your drink. In the [place] one drink equals one 12 oz bottle of beer (355 mL), one 5 oz glass of wine (148 mL), or one 1 oz glass of hard liquor (44 mL). Lifestyle Brush your teeth every morning and night with fluoride toothpaste. Floss one time each day. Exercise for at least 30 minutes 5 or more  days each week. Do not use any products that contain nicotine or tobacco. These products include cigarettes, chewing tobacco, and vaping devices, such as e-cigarettes. If you need help quitting, ask your health care provider. Do not use drugs. If you are  sexually active, practice safe sex. Use a condom or other form of protection to prevent STIs. If you do not wish to become pregnant, use a form of birth control. If you plan to become pregnant, see your health care provider for a prepregnancy visit. Find healthy ways to manage stress, such as: Meditation, yoga, or listening to music. Journaling. Talking to a trusted person. Spending time with friends and family. Minimize exposure to UV radiation to reduce your risk of skin cancer. Safety Always wear your seat belt while driving or riding in a vehicle. Do not drive: If you have been drinking alcohol. Do not ride with someone who has been drinking. If you have been using any mind-altering substances or drugs. While texting. When you are tired or distracted. Wear a helmet and other protective equipment during sports activities. If you have firearms in your house, make sure you follow all gun safety procedures. Seek help if you have been physically or sexually abused. What's next? Go to your health care provider once a year for an annual wellness visit. Ask your health care provider how often you should have your eyes and teeth checked. Stay up to date on all vaccines. This information is not intended to replace advice given to you by your health care provider. Make sure you discuss any questions you have with your health care provider. Document Revised: 03/25/2021 Document Reviewed: 03/25/2021 Elsevier Patient Education  [place].

## 2021-12-08 LAB — CMP14+EGFR
ALT: 9 IU/L (ref 0–32)
AST: 15 IU/L (ref 0–40)
Albumin/Globulin Ratio: 1.4 (ref 1.2–2.2)
Albumin: 4.2 g/dL (ref 3.9–5.0)
Alkaline Phosphatase: 116 IU/L (ref 44–121)
BUN/Creatinine Ratio: 11 (ref 9–23)
BUN: 8 mg/dL (ref 6–20)
Bilirubin Total: 0.2 mg/dL (ref 0.0–1.2)
CO2: 19 mmol/L — ABNORMAL LOW (ref 20–29)
Calcium: 9.1 mg/dL (ref 8.7–10.2)
Chloride: 104 mmol/L (ref 96–106)
Creatinine, Ser: 0.76 mg/dL (ref 0.57–1.00)
Globulin, Total: 3 g/dL (ref 1.5–4.5)
Glucose: 76 mg/dL (ref 70–99)
Potassium: 3.6 mmol/L (ref 3.5–5.2)
Sodium: 137 mmol/L (ref 134–144)
Total Protein: 7.2 g/dL (ref 6.0–8.5)
eGFR: 108 mL/min/{1.73_m2} (ref >59)

## 2021-12-08 LAB — LIPID PANEL
Chol/HDL Ratio: 4 ratio (ref 0.0–4.4)
Cholesterol, Total: 166 mg/dL (ref 100–199)
HDL: 41 mg/dL (ref >39)
LDL Chol Calc (NIH): 110 mg/dL — ABNORMAL HIGH (ref 0–99)
Triglycerides: 78 mg/dL (ref 0–149)
VLDL Cholesterol Cal: 15 mg/dL (ref 5–40)

## 2021-12-08 LAB — HEPATITIS C ANTIBODY: Hep C Virus Ab: NONREACTIVE

## 2021-12-08 LAB — CBC
Hematocrit: 41.7 % (ref 34.0–46.6)
Hemoglobin: 13.7 g/dL (ref 11.1–15.9)
MCH: 28.6 pg (ref 26.6–33.0)
MCHC: 32.9 g/dL (ref 31.5–35.7)
MCV: 87 fL (ref 79–97)
Platelets: 255 10*3/uL (ref 150–450)
RBC: 4.79 x10E6/uL (ref 3.77–5.28)
RDW: 13.1 % (ref 11.7–15.4)
WBC: 4.4 10*3/uL (ref 3.4–10.8)

## 2021-12-08 LAB — HEMOGLOBIN A1C
Est. average glucose Bld gHb Est-mCnc: 103 mg/dL
Hgb A1c MFr Bld: 5.2 % (ref 4.8–5.6)

## 2021-12-08 LAB — TSH: TSH: 1.96 u[IU]/mL (ref 0.450–4.500)

## 2021-12-08 NOTE — Progress Notes (Signed)
Kidney function normal.   Liver function normal.   Thyroid function normal.   No diabetes.   No anemia.   Hepatitis C negative.   LDL cholesterol (sometimes called "bad cholesterol") mildly higher than expected. High cholesterol may increase risk of heart attack and/or stroke. Consider eating more fruits, vegetables, and lean baked meats such as chicken or fish. Moderate intensity exercise at least 150 minutes as tolerated per week may help as well. No medication needed as of present. Encouraged to recheck fasting cholesterol routinely.

## 2021-12-17 DIAGNOSIS — H4710 Unspecified papilledema: Secondary | ICD-10-CM | POA: Diagnosis not present

## 2021-12-17 DIAGNOSIS — G932 Benign intracranial hypertension: Secondary | ICD-10-CM | POA: Diagnosis not present

## 2021-12-25 NOTE — Pre-Procedure Instructions (Signed)
Surgical Instructions ? ? ? Your procedure is scheduled on Monday, January 04, 2022 at 10:30 AM. ? Report to [facility] Main Entrance "A" at 8:30 A.M., then check in with the Admitting office. ? Call this number if you have problems the morning of surgery: ? [phone] ? ? If you have any questions prior to your surgery date call [phone]: Open Monday-Friday 8am-4pm ? ? ? Remember: ? Do not eat after midnight the night before your surgery ? ?You may drink clear liquids until 7:30 AM the morning of your surgery.   ?Clear liquids allowed are: Water, Non-Citrus Juices (without pulp), Carbonated Beverages, Clear Tea, Black Coffee Only (NO MILK, CREAM OR POWDERED CREAMER of any kind), and Gatorade. ?  ? Take these medicines the morning of surgery with A SIP OF WATER: ? ?acetaZOLAMIDE (DIAMOX) ?fluticasone (FLONASE) ?loratadine (CLARITIN)  ? ?IF NEEDED: ?albuterol (PROVENTIL HFA;VENTOLIN HFA)  ?AUVI-Q  ?NURTEC ? ?Please bring all inhalers with you the day of surgery.  ? ? ?As of today, STOP taking any Aspirin (unless otherwise instructed by your surgeon) Aleve, Naproxen, Ibuprofen, Motrin, Advil, Goody's, BC's, all herbal medications, fish oil, and all vitamins. ?         ?           ?Do NOT Smoke (Tobacco/Vaping) for 24 hours prior to your procedure. ? ?If you use a CPAP at night, you may bring your mask/headgear for your overnight stay. ?  ?Contacts, glasses, piercing's, hearing aid's, dentures or partials may not be worn into surgery, please bring cases for these belongings.  ?  ?For patients admitted to the hospital, discharge time will be determined by your treatment team. ?  ?Patients discharged the day of surgery will not be allowed to drive home, and someone needs to stay with them for 24 hours. ? ?NO VISITORS WILL BE ALLOWED IN PRE-OP WHERE PATIENTS ARE PREPPED FOR SURGERY.  ONLY 1 SUPPORT PERSON MAY BE PRESENT IN THE WAITING ROOM WHILE YOU ARE IN SURGERY.  IF YOU ARE TO BE ADMITTED, ONCE YOU ARE IN YOUR ROOM  YOU WILL BE ALLOWED TWO (2) VISITORS. (1) VISITOR MAY STAY OVERNIGHT BUT MUST ARRIVE TO THE ROOM BY 8pm.  Minor children may have two parents present. Special consideration for safety and communication needs will be reviewed on a case by case basis. ? ? ?Special instructions:   ?[place]- Preparing For Surgery ? ?Before surgery, you can play an important role. Because skin is not sterile, your skin needs to be as free of germs as possible. You can reduce the number of germs on your skin by washing with CHG (chlorahexidine gluconate) Soap before surgery.  CHG is an antiseptic cleaner which kills germs and bonds with the skin to continue killing germs even after washing.   ? ?Oral Hygiene is also important to reduce your risk of infection.  Remember - BRUSH YOUR TEETH THE MORNING OF SURGERY WITH YOUR REGULAR TOOTHPASTE ? ?Please do not use if you have an allergy to CHG or antibacterial soaps. If your skin becomes reddened/irritated stop using the CHG.  ?Do not shave (including legs and underarms) for at least 48 hours prior to first CHG shower. It is OK to shave your face. ? ?Please follow these instructions carefully. ?  ?Shower the NIGHT BEFORE SURGERY and the MORNING OF SURGERY ? ?If you chose to wash your hair, wash your hair first as usual with your normal shampoo. ? ?After you shampoo, rinse your hair and body  thoroughly to remove the shampoo. ? ?Use CHG Soap as you would any other liquid soap. You can apply CHG directly to the skin and wash gently with a scrungie or a clean washcloth.  ? ?Apply the CHG Soap to your body ONLY FROM THE NECK DOWN.  Do not use on open wounds or open sores. Avoid contact with your eyes, ears, mouth and genitals (private parts). Wash Face and genitals (private parts)  with your normal soap.  ? ?Wash thoroughly, paying special attention to the area where your surgery will be performed. ? ?Thoroughly rinse your body with warm water from the neck down. ? ?DO NOT shower/wash with your  normal soap after using and rinsing off the CHG Soap. ? ?Pat yourself dry with a CLEAN TOWEL. ? ?Wear CLEAN PAJAMAS to bed the night before surgery ? ?Place CLEAN SHEETS on your bed the night before your surgery ? ?DO NOT SLEEP WITH PETS. ? ? ?Day of Surgery: ?Take a shower with CHG soap. ?Do not wear jewelry or makeup ?Do not wear lotions, powders, perfumes, or deodorant. ?Do not shave 48 hours prior to surgery. ?Do not bring valuables to the hospital.  ?[facility] is not responsible for any belongings or valuables. ?Do not wear nail polish, gel polish, artificial nails, or any other type of covering on natural nails (fingers and toes) ?If you have artificial nails or gel coating that need to be removed by a nail salon, please have this removed prior to surgery. Artificial nails or gel coating may interfere with anesthesia's ability to adequately monitor your vital signs. ?Wear Clean/Comfortable clothing the morning of surgery ?Do not apply any deodorants/lotions.   ?Remember to brush your teeth WITH YOUR REGULAR TOOTHPASTE. ?  ?Please read over the following fact sheets that you were given. ?

## 2021-12-28 ENCOUNTER — Encounter (HOSPITAL_COMMUNITY): Payer: Self-pay

## 2021-12-28 ENCOUNTER — Encounter (HOSPITAL_COMMUNITY)
Admission: RE | Admit: 2021-12-28 | Discharge: 2021-12-28 | Disposition: A | Discharge status: Home / Self Care | Payer: 59 | Source: Ambulatory Visit | Attending: General Surgery | Admitting: General Surgery

## 2021-12-28 ENCOUNTER — Encounter: Payer: Self-pay | Admitting: Family

## 2021-12-28 ENCOUNTER — Other Ambulatory Visit: Payer: Self-pay

## 2021-12-28 VITALS — BP 136/95 | HR 63 | Temp 98.2°F | Resp 18 | Ht 62.0 in | Wt 210.6 lb

## 2021-12-28 DIAGNOSIS — K8020 Calculus of gallbladder without cholecystitis without obstruction: Secondary | ICD-10-CM | POA: Diagnosis not present

## 2021-12-28 DIAGNOSIS — Z01812 Encounter for preprocedural laboratory examination: Secondary | ICD-10-CM | POA: Diagnosis not present

## 2021-12-28 DIAGNOSIS — Z01818 Encounter for other preprocedural examination: Secondary | ICD-10-CM

## 2021-12-28 LAB — CBC
HCT: 40 % (ref 36.0–46.0)
Hemoglobin: 13.4 g/dL (ref 12.0–15.0)
MCH: 28.7 pg (ref 26.0–34.0)
MCHC: 33.5 g/dL (ref 30.0–36.0)
MCV: 85.7 fL (ref 80.0–100.0)
Platelets: 255 10*3/uL (ref 150–400)
RBC: 4.67 MIL/uL (ref 3.87–5.11)
RDW: 13.5 % (ref 11.5–15.5)
WBC: 4.1 10*3/uL (ref 4.0–10.5)
nRBC: 0 % (ref 0.0–0.2)

## 2021-12-28 LAB — COMPREHENSIVE METABOLIC PANEL
ALT: 12 U/L (ref 0–44)
AST: 15 U/L (ref 15–41)
Albumin: 3.8 g/dL (ref 3.5–5.0)
Alkaline Phosphatase: 86 U/L (ref 38–126)
Anion gap: 9 (ref 5–15)
BUN: 11 mg/dL (ref 6–20)
CO2: 22 mmol/L (ref 22–32)
Calcium: 9 mg/dL (ref 8.9–10.3)
Chloride: 107 mmol/L (ref 98–111)
Creatinine, Ser: 0.9 mg/dL (ref 0.44–1.00)
GFR, Estimated: >60 mL/min (ref >60)
Glucose, Bld: 84 mg/dL (ref 70–99)
Potassium: 3.2 mmol/L — ABNORMAL LOW (ref 3.5–5.1)
Sodium: 138 mmol/L (ref 135–145)
Total Bilirubin: 0.8 mg/dL (ref 0.3–1.2)
Total Protein: 7.1 g/dL (ref 6.5–8.1)

## 2021-12-28 NOTE — Progress Notes (Signed)
PCP - Ricky Stabs, NP ?Cardiologist - denies ? ? ? ?Chest x-ray - denies ?EKG - denies ?Stress Test - denies ?ECHO - denies ?Cardiac Cath - denies ? ?Sleep Study - denies ? ?DM- denies ? ? ?ASA/Blood Thinner Instructions: n/a ? ? ?ERAS Protcol - yes, no drink ? ? ?COVID TEST- n/a, ambulatory surgery ? ? ?Anesthesia review: no ? ?Patient denies shortness of breath, fever, cough and chest pain at PAT appointment ? ? ?All instructions explained to the patient, with a verbal understanding of the material. Patient agrees to go over the instructions while at home for a better understanding. Patient also instructed to wear a mask in public for 3 days prior to surgery. The opportunity to ask questions was provided. ?  ?

## 2021-12-29 ENCOUNTER — Other Ambulatory Visit (HOSPITAL_COMMUNITY): Payer: 59

## 2022-01-01 NOTE — Progress Notes (Signed)
pt notified of time change, arrival at 0630 ?

## 2022-01-03 NOTE — Anesthesia Preprocedure Evaluation (Addendum)
Anesthesia Evaluation  ?Patient identified by MRN, date of birth, ID band ?Patient awake ? ? ? ?Reviewed: ?Allergy & Precautions, NPO status , Patient's Chart, lab work & pertinent test results ? ?Airway ?Mallampati: I ? ?TM Distance: >3 FB ?Neck ROM: Full ? ? ? Dental ?no notable dental hx. ? ?  ?Pulmonary ?asthma ,  ?  ?Pulmonary exam normal ?breath sounds clear to auscultation ? ? ? ? ? ? Cardiovascular ?negative cardio ROS ?Normal cardiovascular exam ?Rhythm:Regular Rate:Normal ? ? ?  ?Neuro/Psych ? Headaches, negative psych ROS  ? GI/Hepatic ?negative GI ROS, Neg liver ROS,   ?Endo/Other  ?negative endocrine ROS ? Renal/GU ?negative Renal ROS  ? ?  ?Musculoskeletal ?negative musculoskeletal ROS ?(+)  ? Abdominal ?(+) + obese,   ?Peds ? Hematology ?negative hematology ROS ?(+)   ?Anesthesia Other Findings ?GALLSTONES ? Reproductive/Obstetrics ?hcg negative ? ?  ? ? ? ? ? ? ? ? ? ? ? ? ? ?  ?  ? ? ? ? ? ? ? ?Anesthesia Physical ?Anesthesia Plan ? ?ASA: 2 ? ?Anesthesia Plan: General  ? ?Post-op Pain Management:   ? ?Induction: Intravenous ? ?PONV Risk Score and Plan: 4 or greater and Ondansetron, Dexamethasone, Midazolam, Scopolamine patch - Pre-op and Treatment may vary due to age or medical condition ? ?Airway Management Planned: Oral ETT ? ?Additional Equipment:  ? ?Intra-op Plan:  ? ?Post-operative Plan: Extubation in OR ? ?Informed Consent: I have reviewed the patients History and Physical, chart, labs and discussed the procedure including the risks, benefits and alternatives for the proposed anesthesia with the patient or authorized representative who has indicated his/her understanding and acceptance.  ? ? ? ?Dental advisory given ? ?Plan Discussed with: CRNA ? ?Anesthesia Plan Comments:   ? ? ? ? ? ?Anesthesia Quick Evaluation ? ?

## 2022-01-04 ENCOUNTER — Encounter (HOSPITAL_COMMUNITY): Payer: Self-pay | Admitting: General Surgery

## 2022-01-04 ENCOUNTER — Encounter (HOSPITAL_COMMUNITY)
Admission: RE | Disposition: A | Discharge status: Home / Self Care | Payer: Self-pay | Source: Home / Self Care | Attending: General Surgery

## 2022-01-04 ENCOUNTER — Ambulatory Visit (HOSPITAL_BASED_OUTPATIENT_CLINIC_OR_DEPARTMENT_OTHER): Payer: 59 | Admitting: Certified Registered"

## 2022-01-04 ENCOUNTER — Other Ambulatory Visit: Payer: Self-pay

## 2022-01-04 ENCOUNTER — Ambulatory Visit (HOSPITAL_COMMUNITY): Payer: 59 | Admitting: Certified Registered"

## 2022-01-04 ENCOUNTER — Ambulatory Visit (HOSPITAL_COMMUNITY)
Admission: RE | Admit: 2022-01-04 | Discharge: 2022-01-04 | Disposition: A | Discharge status: Home / Self Care | Payer: 59 | Attending: General Surgery | Admitting: General Surgery

## 2022-01-04 DIAGNOSIS — J45909 Unspecified asthma, uncomplicated: Secondary | ICD-10-CM | POA: Insufficient documentation

## 2022-01-04 DIAGNOSIS — K8010 Calculus of gallbladder with chronic cholecystitis without obstruction: Secondary | ICD-10-CM | POA: Insufficient documentation

## 2022-01-04 DIAGNOSIS — Z6838 Body mass index (BMI) 38.0-38.9, adult: Secondary | ICD-10-CM | POA: Diagnosis not present

## 2022-01-04 DIAGNOSIS — Z91041 Radiographic dye allergy status: Secondary | ICD-10-CM | POA: Insufficient documentation

## 2022-01-04 DIAGNOSIS — K8020 Calculus of gallbladder without cholecystitis without obstruction: Secondary | ICD-10-CM | POA: Diagnosis not present

## 2022-01-04 DIAGNOSIS — E669 Obesity, unspecified: Secondary | ICD-10-CM | POA: Diagnosis not present

## 2022-01-04 HISTORY — PX: CHOLECYSTECTOMY: SHX55

## 2022-01-04 LAB — POCT PREGNANCY, URINE: Preg Test, Ur: NEGATIVE

## 2022-01-04 SURGERY — LAPAROSCOPIC CHOLECYSTECTOMY
Anesthesia: General | Site: Abdomen

## 2022-01-04 MED ORDER — FENTANYL CITRATE (PF) 250 MCG/5ML IJ SOLN
INTRAMUSCULAR | Status: AC
  Filled 2022-01-04: qty 5

## 2022-01-04 MED ORDER — CHLORHEXIDINE GLUCONATE CLOTH 2 % EX PADS
6.0000 | MEDICATED_PAD | Freq: Once | CUTANEOUS | Status: DC
Start: 2022-01-04 — End: 2022-01-04

## 2022-01-04 MED ORDER — LIDOCAINE 2% (20 MG/ML) 5 ML SYRINGE
INTRAMUSCULAR | Status: AC
  Filled 2022-01-04: qty 5

## 2022-01-04 MED ORDER — AMISULPRIDE (ANTIEMETIC) 5 MG/2ML IV SOLN
INTRAVENOUS | Status: AC
  Filled 2022-01-04: qty 4

## 2022-01-04 MED ORDER — DEXAMETHASONE SODIUM PHOSPHATE 10 MG/ML IJ SOLN
INTRAMUSCULAR | Status: DC | PRN
Start: 2022-01-04 — End: 2022-01-04
  Administered 2022-01-04: 10 mg via INTRAVENOUS

## 2022-01-04 MED ORDER — BUPIVACAINE-EPINEPHRINE 0.25% -1:200000 IJ SOLN
INTRAMUSCULAR | Status: DC | PRN
  Administered 2022-01-04: 22 mL

## 2022-01-04 MED ORDER — PHENYLEPHRINE 40 MCG/ML (10ML) SYRINGE FOR IV PUSH (FOR BLOOD PRESSURE SUPPORT)
PREFILLED_SYRINGE | INTRAVENOUS | Status: AC
  Filled 2022-01-04: qty 10

## 2022-01-04 MED ORDER — FENTANYL CITRATE (PF) 250 MCG/5ML IJ SOLN
INTRAMUSCULAR | Status: DC | PRN
  Administered 2022-01-04: 150 ug via INTRAVENOUS

## 2022-01-04 MED ORDER — ONDANSETRON HCL 4 MG/2ML IJ SOLN
INTRAMUSCULAR | Status: DC | PRN
  Administered 2022-01-04: 4 mg via INTRAVENOUS

## 2022-01-04 MED ORDER — MIDAZOLAM HCL 5 MG/5ML IJ SOLN
INTRAMUSCULAR | Status: DC | PRN
Start: 2022-01-04 — End: 2022-01-04
  Administered 2022-01-04: 2 mg via INTRAVENOUS

## 2022-01-04 MED ORDER — SODIUM CHLORIDE 0.9 % IR SOLN
Status: DC | PRN
  Administered 2022-01-04: 1000 mL

## 2022-01-04 MED ORDER — SCOPOLAMINE 1 MG/3DAYS TD PT72
MEDICATED_PATCH | TRANSDERMAL | Status: AC
  Filled 2022-01-04: qty 1

## 2022-01-04 MED ORDER — OXYCODONE HCL 5 MG PO TABS: 5.0000 mg | ORAL_TABLET | Freq: Once | ORAL | Status: DC | PRN

## 2022-01-04 MED ORDER — SUGAMMADEX SODIUM 500 MG/5ML IV SOLN
INTRAVENOUS | Status: DC | PRN
  Administered 2022-01-04: 400 mg via INTRAVENOUS

## 2022-01-04 MED ORDER — ACETAMINOPHEN 500 MG PO TABS
1000.0000 mg | ORAL_TABLET | ORAL | Status: AC
  Administered 2022-01-04: 1000 mg via ORAL
  Filled 2022-01-04: qty 2

## 2022-01-04 MED ORDER — DEXAMETHASONE SODIUM PHOSPHATE 10 MG/ML IJ SOLN
INTRAMUSCULAR | Status: AC
  Filled 2022-01-04: qty 1

## 2022-01-04 MED ORDER — ATROPINE SULFATE 0.4 MG/ML IV SOLN
INTRAVENOUS | Status: AC
  Filled 2022-01-04: qty 1

## 2022-01-04 MED ORDER — CELECOXIB 200 MG PO CAPS
200.0000 mg | ORAL_CAPSULE | ORAL | Status: AC
  Administered 2022-01-04: 200 mg via ORAL
  Filled 2022-01-04: qty 1

## 2022-01-04 MED ORDER — OXYCODONE HCL 5 MG PO TABS: 5.0000 mg | ORAL_TABLET | Freq: Four times a day (QID) | ORAL | 0 refills | Status: DC | PRN

## 2022-01-04 MED ORDER — PHENYLEPHRINE HCL-NACL 20-0.9 MG/250ML-% IV SOLN
INTRAVENOUS | Status: AC
  Filled 2022-01-04: qty 500

## 2022-01-04 MED ORDER — OXYCODONE HCL 5 MG/5ML PO SOLN: 5.0000 mg | Freq: Once | ORAL | Status: DC | PRN

## 2022-01-04 MED ORDER — FENTANYL CITRATE (PF) 100 MCG/2ML IJ SOLN: 25.0000 ug | INTRAMUSCULAR | Status: DC | PRN

## 2022-01-04 MED ORDER — PROPOFOL 10 MG/ML IV BOLUS
INTRAVENOUS | Status: AC
  Filled 2022-01-04: qty 20

## 2022-01-04 MED ORDER — ROCURONIUM BROMIDE 10 MG/ML (PF) SYRINGE
PREFILLED_SYRINGE | INTRAVENOUS | Status: AC
  Filled 2022-01-04: qty 10

## 2022-01-04 MED ORDER — LACTATED RINGERS IV SOLN: INTRAVENOUS | Status: DC

## 2022-01-04 MED ORDER — LIDOCAINE 2% (20 MG/ML) 5 ML SYRINGE
INTRAMUSCULAR | Status: DC | PRN
  Administered 2022-01-04: 60 mg via INTRAVENOUS

## 2022-01-04 MED ORDER — SUGAMMADEX SODIUM 500 MG/5ML IV SOLN
INTRAVENOUS | Status: AC
  Filled 2022-01-04: qty 5

## 2022-01-04 MED ORDER — CEFAZOLIN SODIUM-DEXTROSE 2-4 GM/100ML-% IV SOLN
2.0000 g | INTRAVENOUS | Status: AC
  Administered 2022-01-04: 2 g via INTRAVENOUS
  Filled 2022-01-04: qty 100

## 2022-01-04 MED ORDER — GABAPENTIN 300 MG PO CAPS
300.0000 mg | ORAL_CAPSULE | ORAL | Status: AC
  Administered 2022-01-04: 300 mg via ORAL
  Filled 2022-01-04: qty 1

## 2022-01-04 MED ORDER — PHENYLEPHRINE HCL (PRESSORS) 10 MG/ML IV SOLN
INTRAVENOUS | Status: DC | PRN
Start: 2022-01-04 — End: 2022-01-04
  Administered 2022-01-04 (×3): 40 ug via INTRAVENOUS
  Administered 2022-01-04: 80 ug via INTRAVENOUS
  Administered 2022-01-04: 40 ug via INTRAVENOUS

## 2022-01-04 MED ORDER — BUPIVACAINE-EPINEPHRINE (PF) 0.25% -1:200000 IJ SOLN
INTRAMUSCULAR | Status: AC
  Filled 2022-01-04: qty 30

## 2022-01-04 MED ORDER — PROPOFOL 10 MG/ML IV BOLUS
INTRAVENOUS | Status: DC | PRN
  Administered 2022-01-04: 200 mg via INTRAVENOUS

## 2022-01-04 MED ORDER — CHLORHEXIDINE GLUCONATE 0.12 % MT SOLN
15.0000 mL | Freq: Once | OROMUCOSAL | Status: AC
  Administered 2022-01-04: 15 mL via OROMUCOSAL
  Filled 2022-01-04: qty 15

## 2022-01-04 MED ORDER — AMISULPRIDE (ANTIEMETIC) 5 MG/2ML IV SOLN
10.0000 mg | Freq: Once | INTRAVENOUS | Status: AC | PRN
  Administered 2022-01-04: 10 mg via INTRAVENOUS

## 2022-01-04 MED ORDER — ORAL CARE MOUTH RINSE: 15.0000 mL | Freq: Once | OROMUCOSAL | Status: AC

## 2022-01-04 MED ORDER — MIDAZOLAM HCL 2 MG/2ML IJ SOLN
INTRAMUSCULAR | Status: AC
  Filled 2022-01-04: qty 2

## 2022-01-04 MED ORDER — ONDANSETRON HCL 4 MG/2ML IJ SOLN
INTRAMUSCULAR | Status: AC
  Filled 2022-01-04: qty 2

## 2022-01-04 MED ORDER — ROCURONIUM BROMIDE 100 MG/10ML IV SOLN
INTRAVENOUS | Status: DC | PRN
  Administered 2022-01-04: 60 mg via INTRAVENOUS

## 2022-01-04 MED ORDER — SCOPOLAMINE 1 MG/3DAYS TD PT72
MEDICATED_PATCH | TRANSDERMAL | Status: DC | PRN
  Administered 2022-01-04: 1 via TRANSDERMAL

## 2022-01-04 MED ORDER — 0.9 % SODIUM CHLORIDE (POUR BTL) OPTIME
TOPICAL | Status: DC | PRN
  Administered 2022-01-04: 1000 mL

## 2022-01-04 SURGICAL SUPPLY — 32 items
APPLIER CLIP 5 13 M/L LIGAMAX5 (MISCELLANEOUS) ×2
BAG RETRIEVAL 10 (BASKET) ×1
BLADE CLIPPER SURG (BLADE) ×1 IMPLANT
CANISTER SUCT 3000ML PPV (MISCELLANEOUS) ×2 IMPLANT
CHLORAPREP W/TINT 26 (MISCELLANEOUS) ×2 IMPLANT
CLIP APPLIE 5 13 M/L LIGAMAX5 (MISCELLANEOUS) ×1 IMPLANT
COVER SURGICAL LIGHT HANDLE (MISCELLANEOUS) ×2 IMPLANT
DERMABOND ADVANCED (GAUZE/BANDAGES/DRESSINGS) ×1
DERMABOND ADVANCED .7 DNX12 (GAUZE/BANDAGES/DRESSINGS) ×1 IMPLANT
ELECT REM PT RETURN 9FT ADLT (ELECTROSURGICAL) ×2
ELECTRODE REM PT RTRN 9FT ADLT (ELECTROSURGICAL) ×1 IMPLANT
GLOVE SURG ENC MOIS LTX SZ7.5 (GLOVE) ×2 IMPLANT
GOWN STRL REUS W/ TWL LRG LVL3 (GOWN DISPOSABLE) ×3 IMPLANT
GOWN STRL REUS W/TWL LRG LVL3 (GOWN DISPOSABLE) ×3
KIT BASIN OR (CUSTOM PROCEDURE TRAY) ×2 IMPLANT
KIT TURNOVER KIT B (KITS) ×2 IMPLANT
NS IRRIG 1000ML POUR BTL (IV SOLUTION) ×2 IMPLANT
PAD ARMBOARD 7.5X6 YLW CONV (MISCELLANEOUS) ×2 IMPLANT
SCISSORS LAP 5X35 DISP (ENDOMECHANICALS) ×2 IMPLANT
SET IRRIG TUBING LAPAROSCOPIC (IRRIGATION / IRRIGATOR) ×2 IMPLANT
SET TUBE SMOKE EVAC HIGH FLOW (TUBING) ×2 IMPLANT
SLEEVE ENDOPATH XCEL 5M (ENDOMECHANICALS) ×4 IMPLANT
SPECIMEN JAR SMALL (MISCELLANEOUS) ×2 IMPLANT
SUT MNCRL AB 4-0 PS2 18 (SUTURE) ×2 IMPLANT
SYS BAG RETRIEVAL 10MM (BASKET) ×1
SYSTEM BAG RETRIEVAL 10MM (BASKET) IMPLANT
TOWEL GREEN STERILE (TOWEL DISPOSABLE) ×2 IMPLANT
TOWEL GREEN STERILE FF (TOWEL DISPOSABLE) ×2 IMPLANT
TRAY LAPAROSCOPIC MC (CUSTOM PROCEDURE TRAY) ×2 IMPLANT
TROCAR XCEL BLUNT TIP 100MML (ENDOMECHANICALS) ×2 IMPLANT
TROCAR XCEL NON-BLD 5MMX100MML (ENDOMECHANICALS) ×2 IMPLANT
WATER STERILE IRR 1000ML POUR (IV SOLUTION) ×2 IMPLANT

## 2022-01-04 NOTE — Op Note (Signed)
01/04/2022 ? ?9:24 AM ? ?PATIENT:  Jane Ellison  31 y.o. female ? ?PRE-OPERATIVE DIAGNOSIS:  GALLSTONES ? ?POST-OPERATIVE DIAGNOSIS:  GALLSTONES ? ?PROCEDURE:  Procedure(s): ?LAPAROSCOPIC CHOLECYSTECTOMY (N/A) ? ?SURGEON:  Surgeon(s) and Role: ?   Jovita Kussmaul, MD - Primary ? ?PHYSICIAN ASSISTANT:  ? ?ASSISTANTS: Izola Price, RNFA  ? ?ANESTHESIA:   local and general ? ?EBL:  5 mL  ? ?BLOOD ADMINISTERED:none ? ?DRAINS: none  ? ?LOCAL MEDICATIONS USED:  MARCAINE    ? ?SPECIMEN:  Source of Specimen:  gallbladder ? ?DISPOSITION OF SPECIMEN:  PATHOLOGY ? ?COUNTS:  YES ? ?TOURNIQUET:  * No tourniquets in log * ? ?DICTATION: .Dragon Dictation ? ? ? ?Procedure: After informed consent was obtained the patient was brought to the operating room and placed in the supine position on the operating room table. After adequate induction of general anesthesia the patient's abdomen was prepped with ChloraPrep allowed to dry and draped in usual sterile manner. An appropriate timeout was performed. The area below the umbilicus was infiltrated with quarter percent  Marcaine. A small incision was made with a 15 blade knife. The incision was carried down through the subcutaneous tissue bluntly with a hemostat and [employer] retractors. The linea alba was identified. The linea alba was incised with a 15 blade knife and each side was grasped with Coker clamps. The preperitoneal space was then probed with a hemostat until the peritoneum was opened and access was gained to the abdominal cavity. A 0 Vicryl pursestring stitch was placed in the fascia surrounding the opening. A Hassan cannula was then placed through the opening and anchored in place with the previously placed Vicryl purse string stitch. The abdomen was insufflated with carbon dioxide without difficulty. A laparoscope was inserted through the [facility] cannula in the right upper quadrant was inspected. Next the epigastric region was infiltrated with ?% Marcaine. A small  incision was made with a 15 blade knife. A 5 mm port was placed bluntly through this incision into the abdominal cavity under direct vision. Next 2 sites were chosen laterally on the right side of the abdomen for placement of 5 mm ports. Each of these areas was infiltrated with quarter percent Marcaine. Small stab incisions were made with a 15 blade knife. 5 mm ports were then placed bluntly through these incisions into the abdominal cavity under direct vision without difficulty. A blunt grasper was placed through the lateralmost 5 mm port and used to grasp the dome of the gallbladder and elevate it anteriorly and superiorly. Another blunt grasper was placed through the other 5 mm port and used to retract the body and neck of the gallbladder. A dissector was placed through the epigastric port and using the electrocautery the peritoneal reflection at the gallbladder neck was opened. Blunt dissection was then carried out in this area until the gallbladder neck-cystic duct junction was readily identified and a good critical window was created. The patient has a contrast allergy and the anatomy was clear so the cholangiogram was not done. Three clips were placed proximally on the cystic duct near the gallbladder and one distally at the gallbladder neck and the duct was divided between the 2 sets of clips. Posterior to this the cystic artery was identified and again dissected bluntly in a circumferential manner until a good window  was created. 2 clips were placed proximally and one distally on the artery and the artery was divided between the 2 sets of clips. Next a laparoscopic hook cautery device was  used to separate the gallbladder from the liver bed. Prior to completely detaching the gallbladder from the liver bed the liver bed was inspected and several small bleeding points were coagulated with the electrocautery until the area was completely hemostatic. The gallbladder was then detached the rest of it from the  liver bed without difficulty. A laparoscopic bag was inserted through the hassan port. The laparoscope was moved to the epigastric port. The gallbladder was placed within the bag and the bag was sealed.  The bag with the gallbladder was then removed with the [facility] cannula through the infraumbilical port without difficulty. The fascial defect was then closed with the previously placed Vicryl pursestring stitch as well as with another figure-of-eight 0 Vicryl stitch. The liver bed was inspected again and found to be hemostatic. The abdomen was irrigated with copious amounts of saline until the effluent was clear. The ports were then removed under direct vision without difficulty and were found to be hemostatic. The gas was allowed to escape. No other abnormalities were noted on general inspection of the abdomen. The skin incisions were all closed with interrupted 4-0 Monocryl subcuticular stitches. Dermabond dressings were applied. The patient tolerated the procedure well. At the end of the case all needle sponge and instrument counts were correct. The patient was then awakened and taken to recovery in stable condition  ? ?PLAN OF CARE: Discharge to home after PACU ? ?PATIENT DISPOSITION:  PACU - hemodynamically stable. ?  ?Delay start of Pharmacological VTE agent (>24hrs) due to surgical blood loss or risk of bleeding: not applicable ? ?

## 2022-01-04 NOTE — H&P (Signed)
?REFERRING PHYSICIAN: Laurann Montana, MD ? ?PROVIDER: Lindell Noe, MD ? ?MRN: [id number] ?DOB: 1990/11/26 ?Subjective  ? ?Chief Complaint: gallbladder ? ? ?History of Present Illness: ?Jane Ellison is a 31 y.o. female who is seen today as an office consultation at the request of Dr. Cliffton Asters for evaluation of gallbladder ?.  ? ?We are asked to see the patient in consultation by Dr. Laurann Montana to evaluate her for gallstones. The patient is a 31 year old black female who has been experiencing severe right upper quadrant pain. The pain occurs about once a month. It tends to occur at night and takes several hours to resolve. The pain seems to radiate around to the right side of her back. The pain has been associated with significant nausea and vomiting. She recently underwent an ultrasound which did show stones in her gallbladder but no gallbladder wall thickening or ductal dilatation. ? ?Review of Systems: ?A complete review of systems was obtained from the patient. I have reviewed this information and discussed as appropriate with the patient. See HPI as well for other ROS. ? ?ROS  ? ?Medical History: ?Past Medical History:  ?Diagnosis Date  ? Asthma, unspecified asthma severity, unspecified whether complicated, unspecified whether persistent  ? Hypertension  ? ?Patient Active Problem List  ?Diagnosis  ? Calculus of gallbladder without cholecystitis without obstruction  ? ?History reviewed. No pertinent surgical history.  ? ?Allergies  ?Allergen Reactions  ? Other Anaphylaxis and Unknown  ?Tree Nuts Only  ? Shellfish Containing Products Other (See Comments) and Anaphylaxis  ? Shellfish Derived Other (See Comments) and Itching  ? Tree Nuts Anaphylaxis, Other (See Comments) and Itching  ? Bee Pollen Other (See Comments)  ?Runny nose  ? Pollen Extracts Other (See Comments)  ? Iodinated Contrast Media Other (See Comments)  ? Iodine Other (See Comments)  ? ?Current Outpatient Medications on File Prior to Visit   ?Medication Sig Dispense Refill  ? acetaZOLAMIDE (DIAMOX) 250 MG tablet 2 tablets  ? loratadine (CLARITIN) 10 mg tablet Take 10 mg by mouth once daily  ? triamcinolone 0.1 % cream APPLY TO ROUND SANDPAPER AREAS ON BODY ONCE DAILY.  ? ?No current facility-administered medications on file prior to visit.  ? ?Family History  ?Problem Relation Age of Onset  ? High blood pressure (Hypertension) Mother  ? ? ?Social History  ? ?Tobacco Use  ?Smoking Status Never  ?Smokeless Tobacco Never  ? ? ?Social History  ? ?Socioeconomic History  ? Marital status: Married  ?Tobacco Use  ? Smoking status: Never  ? Smokeless tobacco: Never  ?Vaping Use  ? Vaping Use: Never used  ?Substance and Sexual Activity  ? Alcohol use: Yes  ? Drug use: Never  ? ?Objective:  ? ?Vitals:  ?BP: 138/80  ?Pulse: (!) 111  ?Temp: 36.4 ?C (97.6 ?F)  ?SpO2: 98%  ?Weight: 95.2 kg (209 lb 12.8 oz)  ?Height: 157.5 cm (5\' 2" )  ? ?Body mass index is 38.37 kg/m?. ? ?Physical Exam ?Vitals reviewed.  ?Constitutional:  ?General: She is not in acute distress. ?Appearance: Normal appearance.  ?HENT:  ?Head: Normocephalic and atraumatic.  ?Right Ear: External ear normal.  ?Left Ear: External ear normal.  ?Nose: Nose normal.  ?Mouth/Throat:  ?Mouth: Mucous membranes are moist.  ?Pharynx: Oropharynx is clear.  ?Eyes:  ?General: No scleral icterus. ?Extraocular Movements: Extraocular movements intact.  ?Conjunctiva/sclera: Conjunctivae normal.  ?Pupils: Pupils are equal, round, and reactive to light.  ?Cardiovascular:  ?Rate and Rhythm: Normal rate and regular rhythm.  ?Pulses:  Normal pulses.  ?Heart sounds: Normal heart sounds.  ?Pulmonary:  ?Effort: Pulmonary effort is normal. No respiratory distress.  ?Breath sounds: Normal breath sounds.  ?Abdominal:  ?General: Bowel sounds are normal.  ?Palpations: Abdomen is soft.  ?Tenderness: There is no abdominal tenderness.  ?Musculoskeletal:  ?General: No swelling, tenderness or deformity. Normal range of motion.  ?Cervical  back: Normal range of motion and neck supple.  ?Skin: ?General: Skin is warm and dry.  ?Coloration: Skin is not jaundiced.  ?Neurological:  ?General: No focal deficit present.  ?Mental Status: She is alert and oriented to person, place, and time.  ?Psychiatric:  ?Mood and Affect: Mood normal.  ?Behavior: Behavior normal.  ? ? ? ?Labs, Imaging and Diagnostic Testing: ? ?Assessment and Plan:  ? ?Diagnoses and all orders for this visit: ? ?Calculus of gallbladder without cholecystitis without obstruction ?- [facility] Case Posting Request; Future ? ? ? ?The patient appears to have symptomatic gallstones. Because of the risk of further painful episodes and possible pancreatitis I feel she would benefit from having her gallbladder removed. She would also like to have this done. I have discussed with her in detail the risks and benefits of the operation as well as some of the technical aspects including the risk of common bile duct injury and she understands and wishes to proceed.  ?

## 2022-01-04 NOTE — Transfer of Care (Signed)
Immediate Anesthesia Transfer of Care Note ? ?Patient: Jane Ellison ? ?Procedure(s) Performed: LAPAROSCOPIC CHOLECYSTECTOMY (Abdomen) ? ?Patient Location: PACU ? ?Anesthesia Type:General ? ?Level of Consciousness: drowsy ? ?Airway & Oxygen Therapy: Patient Spontanous Breathing and Patient connected to face mask oxygen ? ?Post-op Assessment: Report given to RN and Post -op Vital signs reviewed and stable ? ?Post vital signs: Reviewed and stable ? ?Last Vitals:  ?Vitals Value Taken Time  ?BP 117/86 01/04/22 0948  ?Temp    ?Pulse 79 01/04/22 0948  ?Resp 17 01/04/22 0948  ?SpO2 100 % 01/04/22 0948  ?Vitals shown include unvalidated device data. ? ?Last Pain:  ?Vitals:  ? 01/04/22 0724  ?TempSrc:   ?PainSc: 0-No pain  ?   ? ?  ? ?Complications: No notable events documented. ?

## 2022-01-04 NOTE — Anesthesia Procedure Notes (Signed)
Procedure Name: Intubation ?Date/Time: 01/04/2022 8:37 AM ?Performed by: Lavonia Dana, CRNA ?Pre-anesthesia Checklist: Patient identified, Emergency Drugs available, Suction available and Patient being monitored ?Patient Re-evaluated:Patient Re-evaluated prior to induction ?Oxygen Delivery Method: Circle system utilized ?Preoxygenation: Pre-oxygenation with 100% oxygen ?Induction Type: IV induction ?Ventilation: Mask ventilation without difficulty ?Laryngoscope Size: Mac and 3 ?Grade View: Grade I ?Tube type: Oral ?Tube size: 7.0 mm ?Number of attempts: 1 ?Airway Equipment and Method: Stylet and Bite block ?Placement Confirmation: ETT inserted through vocal cords under direct vision, positive ETCO2 and breath sounds checked- equal and bilateral ?Secured at: 22 cm ?Tube secured with: Tape ?Dental Injury: Teeth and Oropharynx as per pre-operative assessment  ? ? ? ? ?

## 2022-01-04 NOTE — Interval H&P Note (Signed)
History and Physical Interval Note: ? ?01/04/2022 ?8:11 AM ? ?Jane Ellison  has presented today for surgery, with the diagnosis of GALLSTONES.  The various methods of treatment have been discussed with the patient and family. After consideration of risks, benefits and other options for treatment, the patient has consented to  Procedure(s): ?LAPAROSCOPIC CHOLECYSTECTOMY WITH INTRAOPERATIVE CHOLANGIOGRAM (N/A) as a surgical intervention.  The patient's history has been reviewed, patient examined, no change in status, stable for surgery.  I have reviewed the patient's chart and labs.  Questions were answered to the patient's satisfaction.   ? ? ?Autumn Messing III ? ? ?

## 2022-01-05 ENCOUNTER — Encounter (HOSPITAL_COMMUNITY): Payer: Self-pay | Admitting: General Surgery

## 2022-01-05 LAB — SURGICAL PATHOLOGY

## 2022-01-05 NOTE — Anesthesia Postprocedure Evaluation (Signed)
Anesthesia Post Note ? ?Patient: Jane Ellison ? ?Procedure(s) Performed: LAPAROSCOPIC CHOLECYSTECTOMY (Abdomen) ? ?  ? ?Patient location during evaluation: PACU ?Anesthesia Type: General ?Level of consciousness: awake ?Pain management: pain level controlled ?Vital Signs Assessment: post-procedure vital signs reviewed and stable ?Respiratory status: spontaneous breathing, nonlabored ventilation, respiratory function stable and patient connected to nasal cannula oxygen ?Cardiovascular status: blood pressure returned to baseline and stable ?Postop Assessment: no apparent nausea or vomiting ?Anesthetic complications: no ? ? ?No notable events documented. ? ?Last Vitals:  ?Vitals:  ? 01/04/22 1002 01/04/22 1017  ?BP: 109/79 129/82  ?Pulse: 62 73  ?Resp: 18 20  ?Temp:  36.8 ?C  ?SpO2: 100% 96%  ?  ?Last Pain:  ?Vitals:  ? 01/04/22 1017  ?TempSrc:   ?PainSc: 0-No pain  ? ? ?  ?  ?  ?  ?  ?  ? ?Lanyiah Brix P Sausha Raymond ? ? ? ? ?

## 2022-04-07 DIAGNOSIS — L219 Seborrheic dermatitis, unspecified: Secondary | ICD-10-CM | POA: Diagnosis not present

## 2022-04-07 DIAGNOSIS — L2084 Intrinsic (allergic) eczema: Secondary | ICD-10-CM | POA: Diagnosis not present

## 2022-08-26 DIAGNOSIS — R030 Elevated blood-pressure reading, without diagnosis of hypertension: Secondary | ICD-10-CM | POA: Diagnosis not present

## 2022-08-26 DIAGNOSIS — H4710 Unspecified papilledema: Secondary | ICD-10-CM | POA: Diagnosis not present

## 2022-08-26 DIAGNOSIS — Z30432 Encounter for removal of intrauterine contraceptive device: Secondary | ICD-10-CM | POA: Diagnosis not present

## 2022-08-26 DIAGNOSIS — G932 Benign intracranial hypertension: Secondary | ICD-10-CM | POA: Diagnosis not present

## 2022-10-11 NOTE — L&D Delivery Note (Signed)
Delivery Note Called to room for delivery. Patient was found to be complete and +2 station. Fetal head was delivered over intact perineum. No nuchal was present. Shoulders and body followed without difficulty. Infant was placed on mother's abdomen, mouth and nose were bulb suctioned and let out a vigorous cry. Delayed cord clamping was performed for 60 seconds. Cord clamped and cut by FOB. Venous cord blood was collected. Placenta delivered spontaneously. Cervix, vagina, perineum and labia were inspected and no lacerations were noted. Uterus fundus firm. Placenta was inspected, found to be intact with a 3 vessel cord. Counts were correct.   At 6:14 PM a viable and healthy female was delivered via Vaginal, Spontaneous (Presentation:  Left Occiput Anterior).  APGAR: 9,9 ; weight: pending Placenta status: Spontaneous;Pathology, Intact.  Cord: 3 vessels with the following complications: Short.  Cord pH: Not collected  Anesthesia: Epidural Episiotomy: None Lacerations:  None Suture Repair:  N/A Est. Blood Loss (mL):  114  Mom to postpartum.  Baby to Couplet care / Skin to Skin.  Steva Ready 07/06/2023, 6:27 PM

## 2022-10-20 ENCOUNTER — Other Ambulatory Visit (HOSPITAL_COMMUNITY)
Admission: RE | Admit: 2022-10-20 | Discharge: 2022-10-20 | Disposition: A | Discharge status: Home / Self Care | Payer: Commercial Managed Care - PPO | Source: Ambulatory Visit | Attending: Nurse Practitioner | Admitting: Nurse Practitioner

## 2022-10-20 DIAGNOSIS — J45909 Unspecified asthma, uncomplicated: Secondary | ICD-10-CM | POA: Diagnosis not present

## 2022-10-20 DIAGNOSIS — Z131 Encounter for screening for diabetes mellitus: Secondary | ICD-10-CM | POA: Diagnosis not present

## 2022-10-20 DIAGNOSIS — E669 Obesity, unspecified: Secondary | ICD-10-CM | POA: Diagnosis not present

## 2022-10-20 DIAGNOSIS — Z124 Encounter for screening for malignant neoplasm of cervix: Secondary | ICD-10-CM | POA: Insufficient documentation

## 2022-10-20 DIAGNOSIS — Z0000 Encounter for general adult medical examination without abnormal findings: Secondary | ICD-10-CM | POA: Diagnosis not present

## 2022-10-20 DIAGNOSIS — Z01419 Encounter for gynecological examination (general) (routine) without abnormal findings: Secondary | ICD-10-CM | POA: Diagnosis not present

## 2022-10-25 LAB — CYTOLOGY - PAP
Comment: NEGATIVE
Diagnosis: NEGATIVE
Diagnosis: REACTIVE
High risk HPV: NEGATIVE

## 2022-11-15 DIAGNOSIS — R109 Unspecified abdominal pain: Secondary | ICD-10-CM | POA: Diagnosis not present

## 2022-11-15 DIAGNOSIS — O09291 Supervision of pregnancy with other poor reproductive or obstetric history, first trimester: Secondary | ICD-10-CM | POA: Diagnosis not present

## 2022-11-15 DIAGNOSIS — O3680X0 Pregnancy with inconclusive fetal viability, not applicable or unspecified: Secondary | ICD-10-CM | POA: Diagnosis not present

## 2022-11-15 DIAGNOSIS — O26891 Other specified pregnancy related conditions, first trimester: Secondary | ICD-10-CM | POA: Diagnosis not present

## 2022-11-23 DIAGNOSIS — Z3480 Encounter for supervision of other normal pregnancy, unspecified trimester: Secondary | ICD-10-CM | POA: Diagnosis not present

## 2022-11-23 DIAGNOSIS — O26891 Other specified pregnancy related conditions, first trimester: Secondary | ICD-10-CM | POA: Diagnosis not present

## 2022-11-23 LAB — OB RESULTS CONSOLE ABO/RH: RH Type: POSITIVE

## 2022-11-23 LAB — OB RESULTS CONSOLE HIV ANTIBODY (ROUTINE TESTING): HIV: NONREACTIVE

## 2022-11-23 LAB — OB RESULTS CONSOLE RUBELLA ANTIBODY, IGM: Rubella: IMMUNE

## 2022-11-23 LAB — OB RESULTS CONSOLE HEPATITIS B SURFACE ANTIGEN: Hepatitis B Surface Ag: NEGATIVE

## 2022-11-23 LAB — HEPATITIS C ANTIBODY: HCV Ab: NEGATIVE

## 2022-12-07 DIAGNOSIS — N9489 Other specified conditions associated with female genital organs and menstrual cycle: Secondary | ICD-10-CM | POA: Diagnosis not present

## 2022-12-24 DIAGNOSIS — Z3481 Encounter for supervision of other normal pregnancy, first trimester: Secondary | ICD-10-CM | POA: Diagnosis not present

## 2022-12-24 DIAGNOSIS — O10011 Pre-existing essential hypertension complicating pregnancy, first trimester: Secondary | ICD-10-CM | POA: Diagnosis not present

## 2022-12-24 DIAGNOSIS — A530 Latent syphilis, unspecified as early or late: Secondary | ICD-10-CM | POA: Diagnosis not present

## 2022-12-24 DIAGNOSIS — O10911 Unspecified pre-existing hypertension complicating pregnancy, first trimester: Secondary | ICD-10-CM | POA: Diagnosis not present

## 2022-12-24 DIAGNOSIS — I10 Essential (primary) hypertension: Secondary | ICD-10-CM | POA: Diagnosis not present

## 2022-12-24 LAB — OB RESULTS CONSOLE GC/CHLAMYDIA
Chlamydia: NEGATIVE
Neisseria Gonorrhea: NEGATIVE

## 2023-01-05 DIAGNOSIS — N9489 Other specified conditions associated with female genital organs and menstrual cycle: Secondary | ICD-10-CM | POA: Diagnosis not present

## 2023-03-03 DIAGNOSIS — O99212 Obesity complicating pregnancy, second trimester: Secondary | ICD-10-CM | POA: Diagnosis not present

## 2023-03-03 DIAGNOSIS — Z36 Encounter for antenatal screening for chromosomal anomalies: Secondary | ICD-10-CM | POA: Diagnosis not present

## 2023-04-15 DIAGNOSIS — Z3482 Encounter for supervision of other normal pregnancy, second trimester: Secondary | ICD-10-CM | POA: Diagnosis not present

## 2023-04-15 DIAGNOSIS — Z36 Encounter for antenatal screening for chromosomal anomalies: Secondary | ICD-10-CM | POA: Diagnosis not present

## 2023-04-15 LAB — OB RESULTS CONSOLE RPR: RPR: NONREACTIVE

## 2023-04-29 DIAGNOSIS — O26843 Uterine size-date discrepancy, third trimester: Secondary | ICD-10-CM | POA: Diagnosis not present

## 2023-04-29 DIAGNOSIS — Z23 Encounter for immunization: Secondary | ICD-10-CM | POA: Diagnosis not present

## 2023-05-09 DIAGNOSIS — O26843 Uterine size-date discrepancy, third trimester: Secondary | ICD-10-CM | POA: Diagnosis not present

## 2023-05-09 DIAGNOSIS — O99213 Obesity complicating pregnancy, third trimester: Secondary | ICD-10-CM | POA: Diagnosis not present

## 2023-05-26 DIAGNOSIS — Z3480 Encounter for supervision of other normal pregnancy, unspecified trimester: Secondary | ICD-10-CM | POA: Diagnosis not present

## 2023-05-26 DIAGNOSIS — O99213 Obesity complicating pregnancy, third trimester: Secondary | ICD-10-CM | POA: Diagnosis not present

## 2023-05-26 DIAGNOSIS — O10919 Unspecified pre-existing hypertension complicating pregnancy, unspecified trimester: Secondary | ICD-10-CM | POA: Diagnosis not present

## 2023-05-31 DIAGNOSIS — O99213 Obesity complicating pregnancy, third trimester: Secondary | ICD-10-CM | POA: Diagnosis not present

## 2023-05-31 DIAGNOSIS — O10919 Unspecified pre-existing hypertension complicating pregnancy, unspecified trimester: Secondary | ICD-10-CM | POA: Diagnosis not present

## 2023-06-07 DIAGNOSIS — O10919 Unspecified pre-existing hypertension complicating pregnancy, unspecified trimester: Secondary | ICD-10-CM | POA: Diagnosis not present

## 2023-06-07 DIAGNOSIS — O99213 Obesity complicating pregnancy, third trimester: Secondary | ICD-10-CM | POA: Diagnosis not present

## 2023-06-14 DIAGNOSIS — O99213 Obesity complicating pregnancy, third trimester: Secondary | ICD-10-CM | POA: Diagnosis not present

## 2023-06-14 DIAGNOSIS — O10919 Unspecified pre-existing hypertension complicating pregnancy, unspecified trimester: Secondary | ICD-10-CM | POA: Diagnosis not present

## 2023-06-20 DIAGNOSIS — O10919 Unspecified pre-existing hypertension complicating pregnancy, unspecified trimester: Secondary | ICD-10-CM | POA: Diagnosis not present

## 2023-06-20 DIAGNOSIS — Z23 Encounter for immunization: Secondary | ICD-10-CM | POA: Diagnosis not present

## 2023-06-20 DIAGNOSIS — O99213 Obesity complicating pregnancy, third trimester: Secondary | ICD-10-CM | POA: Diagnosis not present

## 2023-06-20 DIAGNOSIS — Z3490 Encounter for supervision of normal pregnancy, unspecified, unspecified trimester: Secondary | ICD-10-CM | POA: Diagnosis not present

## 2023-06-20 DIAGNOSIS — Z3483 Encounter for supervision of other normal pregnancy, third trimester: Secondary | ICD-10-CM | POA: Diagnosis not present

## 2023-06-20 LAB — OB RESULTS CONSOLE GBS: GBS: NEGATIVE

## 2023-06-22 LAB — OB RESULTS CONSOLE GC/CHLAMYDIA
Chlamydia: NEGATIVE
Neisseria Gonorrhea: NEGATIVE

## 2023-06-28 ENCOUNTER — Telehealth (HOSPITAL_COMMUNITY): Payer: Self-pay | Admitting: *Deleted

## 2023-06-30 ENCOUNTER — Encounter (HOSPITAL_COMMUNITY): Payer: Self-pay

## 2023-06-30 NOTE — Telephone Encounter (Signed)
Preadmission screen  

## 2023-06-30 NOTE — H&P (Signed)
HPI: 32 y.o. G3P1011 @ [redacted]w[redacted]d estimated gestational age (as dated by LMP c/w 6 week ultrasound) presents for induction of labor due to chronic HTN (well-controlled on medication).  Leakage of fluid:  No Vaginal bleeding:  No Contractions:  No Fetal movement:  Yes  Prenatal care has been provided by Dr. Katrinka Blazing. Connye Burkitt [facility]).  ROS:  Denies fevers, chills, chest pain, visual changes, SOB, RUQ/epigastric pain, N/V, dysuria, hematuria, or sudden onset/worsening bilateral LE or facial edema.  Pregnancy complicated by: Chronic HTN (Procardia XL 30mg  daily, ASA 81mg  daily) Anxiety/depression Obesity (BMI 38) Asthma (Albuterol PRN) Sickle cell trait (FOB unknown) Migraines (Follows with [facility], previously on Imitrex PRN) Empty sella (Follows with [facility]) RPR Titer 1:1 x 2 (Treponemal Abs negative x 2) Considering permanent sterilization  Prenatal Transfer Tool  Maternal Diabetes: No Genetic Screening: Declined Maternal Ultrasounds/Referrals: Normal Fetal Ultrasounds or other Referrals:  Other: Serial growth/BPP Maternal Substance Abuse:  No Significant Maternal Medications:  None Significant Maternal Lab Results: Group B Strep negative   Prenatal Labs Blood type:  O Positive Antibody screen:  Negative CBC:  H/H 11.4/34.2 Rubella: Immune RPR:  Non-reactive Hep B:  Negative Hep C:  Negative HIV:  Negative GC/CT:  Negative Glucola:  125.9 (WNL)  Immunizations: Tdap: Given parentally Flu: Received 9/9  Contraceptive plan: Considering tubal sterilization  OBHx:  OB History     Gravida  2   Para  0   Term  0   Preterm  0   AB  1   Living  0      SAB  1   IAB  0   Ectopic  0   Multiple  0   Live Births  0          PMHx: See above Meds:  PNV, Procardia XL 30mg  daily, ASA 91mg  daily Allergy:   Allergies  Allergen Reactions   Other Anaphylaxis, Other (See Comments) and Itching    Tree Nuts Only   Shellfish Allergy  Anaphylaxis and Nausea And Vomiting   Pollen Extract Other (See Comments)    Runny nose - Seasonal Allergies   Iodinated Contrast Media Other (See Comments)   Iodine Other (See Comments)   SurgHx:  Past Surgical History:  Procedure Laterality Date   CHOLECYSTECTOMY N/A 01/04/2022   Procedure: LAPAROSCOPIC CHOLECYSTECTOMY;  Surgeon: Griselda Miner, MD;  Location: [facility] OR;  Service: General;  Laterality: N/A;   WISDOM TOOTH EXTRACTION     SocHx:   Denies Tobacco, ETOH, illicit drugs  O: There were no vitals taken for this visit. Gen. AAOx3, NAD CV.  Regular rate Resp. Normal work of breathing Abd. Gravid, soft, non-tender throughout, no rebound/guarding Extr.  No bilateral LE edema, no calf tenderness bilaterally SVE (9/9): 0.5/thick/high   Serial growth [place]: 06/07/2023 [redacted]w[redacted]d EFW 2162g, 4 lbs 12 oz (24%), AAFV, Cephalic, anterior placenta   Weekly fetal testing: 07/01/2023, BPP 10/10 (NST reactive), fetus cephalic   Labs: see orders  A/P:  32 y.o. G3P1011 @ [redacted]w[redacted]d who is admitted for induction of labor for chronic HTN.  - Admit to L&D - Admit labs (CBC, T&S, RPR, Preeclampsia labs) - CEFM/Toco - Diet:  Clear liquids - IVF:  LR at 125cc/hour - VTE Prophylaxis:  SCDs - GBS Status:  Negative - Presentation:  Confirm prior to IOL - Pain control:  Per patient request - Induction method:  Cytotec for cervical ripening - Procardia XL ordered - Anticipate SVD  Steva Ready, DO

## 2023-07-01 ENCOUNTER — Encounter (HOSPITAL_COMMUNITY): Payer: Self-pay | Admitting: Obstetrics and Gynecology

## 2023-07-01 DIAGNOSIS — O10913 Unspecified pre-existing hypertension complicating pregnancy, third trimester: Secondary | ICD-10-CM | POA: Diagnosis not present

## 2023-07-01 DIAGNOSIS — O99210 Obesity complicating pregnancy, unspecified trimester: Secondary | ICD-10-CM | POA: Diagnosis not present

## 2023-07-04 ENCOUNTER — Encounter (HOSPITAL_COMMUNITY): Payer: Self-pay | Admitting: *Deleted

## 2023-07-04 ENCOUNTER — Telehealth (HOSPITAL_COMMUNITY): Payer: Self-pay | Admitting: *Deleted

## 2023-07-04 NOTE — Telephone Encounter (Signed)
Preadmission screen  

## 2023-07-06 ENCOUNTER — Encounter (HOSPITAL_COMMUNITY): Payer: Self-pay | Admitting: Obstetrics and Gynecology

## 2023-07-06 ENCOUNTER — Other Ambulatory Visit: Payer: Self-pay

## 2023-07-06 ENCOUNTER — Inpatient Hospital Stay (HOSPITAL_COMMUNITY)
Admission: AD | Admit: 2023-07-06 | Discharge: 2023-07-08 | DRG: 806 | Disposition: A | Discharge status: Home / Self Care | Payer: 59 | Attending: Obstetrics and Gynecology | Admitting: Obstetrics and Gynecology

## 2023-07-06 ENCOUNTER — Inpatient Hospital Stay (HOSPITAL_COMMUNITY): Payer: 59 | Admitting: Anesthesiology

## 2023-07-06 ENCOUNTER — Inpatient Hospital Stay (HOSPITAL_COMMUNITY): Payer: 59

## 2023-07-06 DIAGNOSIS — O9952 Diseases of the respiratory system complicating childbirth: Secondary | ICD-10-CM | POA: Diagnosis not present

## 2023-07-06 DIAGNOSIS — Z91013 Allergy to seafood: Secondary | ICD-10-CM | POA: Diagnosis not present

## 2023-07-06 DIAGNOSIS — O99354 Diseases of the nervous system complicating childbirth: Secondary | ICD-10-CM | POA: Diagnosis present

## 2023-07-06 DIAGNOSIS — G43909 Migraine, unspecified, not intractable, without status migrainosus: Secondary | ICD-10-CM | POA: Diagnosis present

## 2023-07-06 DIAGNOSIS — O1092 Unspecified pre-existing hypertension complicating childbirth: Secondary | ICD-10-CM | POA: Diagnosis not present

## 2023-07-06 DIAGNOSIS — D573 Sickle-cell trait: Secondary | ICD-10-CM | POA: Diagnosis not present

## 2023-07-06 DIAGNOSIS — O43899 Other placental disorders, unspecified trimester: Secondary | ICD-10-CM | POA: Diagnosis not present

## 2023-07-06 DIAGNOSIS — J45909 Unspecified asthma, uncomplicated: Secondary | ICD-10-CM | POA: Diagnosis present

## 2023-07-06 DIAGNOSIS — Z3A38 38 weeks gestation of pregnancy: Secondary | ICD-10-CM | POA: Diagnosis not present

## 2023-07-06 DIAGNOSIS — O99214 Obesity complicating childbirth: Secondary | ICD-10-CM | POA: Diagnosis present

## 2023-07-06 DIAGNOSIS — O10919 Unspecified pre-existing hypertension complicating pregnancy, unspecified trimester: Principal | ICD-10-CM | POA: Diagnosis present

## 2023-07-06 DIAGNOSIS — O9902 Anemia complicating childbirth: Secondary | ICD-10-CM | POA: Diagnosis present

## 2023-07-06 DIAGNOSIS — Z91041 Radiographic dye allergy status: Secondary | ICD-10-CM

## 2023-07-06 DIAGNOSIS — O365990 Maternal care for other known or suspected poor fetal growth, unspecified trimester, not applicable or unspecified: Secondary | ICD-10-CM | POA: Diagnosis not present

## 2023-07-06 LAB — COMPREHENSIVE METABOLIC PANEL
ALT: 22 U/L (ref 0–44)
AST: 20 U/L (ref 15–41)
Albumin: 2.4 g/dL — ABNORMAL LOW (ref 3.5–5.0)
Alkaline Phosphatase: 161 U/L — ABNORMAL HIGH (ref 38–126)
Anion gap: 12 (ref 5–15)
BUN: 6 mg/dL (ref 6–20)
CO2: 21 mmol/L — ABNORMAL LOW (ref 22–32)
Calcium: 8.8 mg/dL — ABNORMAL LOW (ref 8.9–10.3)
Chloride: 103 mmol/L (ref 98–111)
Creatinine, Ser: 0.7 mg/dL (ref 0.44–1.00)
GFR, Estimated: >60 mL/min (ref >60)
Glucose, Bld: 112 mg/dL — ABNORMAL HIGH (ref 70–99)
Potassium: 3.3 mmol/L — ABNORMAL LOW (ref 3.5–5.1)
Sodium: 136 mmol/L (ref 135–145)
Total Bilirubin: 0.7 mg/dL (ref 0.3–1.2)
Total Protein: 6.2 g/dL — ABNORMAL LOW (ref 6.5–8.1)

## 2023-07-06 LAB — PROTEIN / CREATININE RATIO, URINE
Creatinine, Urine: 41 mg/dL
Total Protein, Urine: <6 mg/dL

## 2023-07-06 LAB — CBC
HCT: 33.3 % — ABNORMAL LOW (ref 36.0–46.0)
Hemoglobin: 11.7 g/dL — ABNORMAL LOW (ref 12.0–15.0)
MCH: 30.4 pg (ref 26.0–34.0)
MCHC: 35.1 g/dL (ref 30.0–36.0)
MCV: 86.5 fL (ref 80.0–100.0)
Platelets: 219 10*3/uL (ref 150–400)
RBC: 3.85 MIL/uL — ABNORMAL LOW (ref 3.87–5.11)
RDW: 13.4 % (ref 11.5–15.5)
WBC: 6 10*3/uL (ref 4.0–10.5)
nRBC: 0 % (ref 0.0–0.2)

## 2023-07-06 LAB — TYPE AND SCREEN
ABO/RH(D): O POS
Antibody Screen: NEGATIVE

## 2023-07-06 LAB — RPR: RPR Ser Ql: NONREACTIVE

## 2023-07-06 LAB — LACTATE DEHYDROGENASE: LDH: 169 U/L (ref 98–192)

## 2023-07-06 MED ORDER — OXYTOCIN BOLUS FROM INFUSION
333.0000 mL | Freq: Once | INTRAVENOUS | Status: AC
  Administered 2023-07-06: 333 mL via INTRAVENOUS

## 2023-07-06 MED ORDER — DIPHENHYDRAMINE HCL 50 MG/ML IJ SOLN: 12.5000 mg | INTRAMUSCULAR | Status: DC | PRN

## 2023-07-06 MED ORDER — PRENATAL MULTIVITAMIN CH
1.0000 | ORAL_TABLET | Freq: Every day | ORAL | Status: DC
  Administered 2023-07-07 – 2023-07-08 (×2): 1 via ORAL
  Filled 2023-07-06 (×2): qty 1

## 2023-07-06 MED ORDER — COCONUT OIL OIL: 1.0000 | TOPICAL_OIL | Status: DC | PRN

## 2023-07-06 MED ORDER — ACETAMINOPHEN 325 MG PO TABS
650.0000 mg | ORAL_TABLET | ORAL | Status: DC | PRN
  Administered 2023-07-07 (×2): 650 mg via ORAL
  Filled 2023-07-06 (×2): qty 2

## 2023-07-06 MED ORDER — PHENYLEPHRINE 80 MCG/ML (10ML) SYRINGE FOR IV PUSH (FOR BLOOD PRESSURE SUPPORT): 80.0000 ug | PREFILLED_SYRINGE | INTRAVENOUS | Status: DC | PRN

## 2023-07-06 MED ORDER — ONDANSETRON HCL 4 MG/2ML IJ SOLN: 4.0000 mg | Freq: Four times a day (QID) | INTRAMUSCULAR | Status: DC | PRN

## 2023-07-06 MED ORDER — HYDRALAZINE HCL 20 MG/ML IJ SOLN: 5.0000 mg | INTRAMUSCULAR | Status: DC | PRN

## 2023-07-06 MED ORDER — OXYTOCIN-SODIUM CHLORIDE 30-0.9 UT/500ML-% IV SOLN: 1.0000 m[IU]/min | INTRAVENOUS | Status: DC

## 2023-07-06 MED ORDER — TERBUTALINE SULFATE 1 MG/ML IJ SOLN
0.2500 mg | Freq: Once | INTRAMUSCULAR | Status: AC | PRN
  Administered 2023-07-06: 0.25 mg via SUBCUTANEOUS
  Filled 2023-07-06: qty 1

## 2023-07-06 MED ORDER — LACTATED RINGERS IV SOLN: 500.0000 mL | INTRAVENOUS | Status: DC | PRN

## 2023-07-06 MED ORDER — DIPHENHYDRAMINE HCL 25 MG PO CAPS: 25.0000 mg | ORAL_CAPSULE | Freq: Four times a day (QID) | ORAL | Status: DC | PRN

## 2023-07-06 MED ORDER — SENNOSIDES-DOCUSATE SODIUM 8.6-50 MG PO TABS
2.0000 | ORAL_TABLET | Freq: Every day | ORAL | Status: DC
  Administered 2023-07-07 – 2023-07-08 (×2): 2 via ORAL
  Filled 2023-07-06 (×2): qty 2

## 2023-07-06 MED ORDER — HYDROXYZINE HCL 50 MG PO TABS: 50.0000 mg | ORAL_TABLET | Freq: Four times a day (QID) | ORAL | Status: DC | PRN

## 2023-07-06 MED ORDER — LIDOCAINE HCL (PF) 1 % IJ SOLN
INTRAMUSCULAR | Status: DC | PRN
  Administered 2023-07-06: 5 mL via EPIDURAL

## 2023-07-06 MED ORDER — ALBUTEROL SULFATE (2.5 MG/3ML) 0.083% IN NEBU: 3.0000 mL | INHALATION_SOLUTION | RESPIRATORY_TRACT | Status: DC | PRN

## 2023-07-06 MED ORDER — DIBUCAINE (PERIANAL) 1 % EX OINT: 1.0000 | TOPICAL_OINTMENT | CUTANEOUS | Status: DC | PRN

## 2023-07-06 MED ORDER — IBUPROFEN 600 MG PO TABS
600.0000 mg | ORAL_TABLET | Freq: Four times a day (QID) | ORAL | Status: DC
  Administered 2023-07-06: 600 mg via ORAL
  Filled 2023-07-06: qty 1

## 2023-07-06 MED ORDER — LIDOCAINE HCL (PF) 1 % IJ SOLN: 30.0000 mL | INTRAMUSCULAR | Status: DC | PRN

## 2023-07-06 MED ORDER — ONDANSETRON HCL 4 MG/2ML IJ SOLN: 4.0000 mg | INTRAMUSCULAR | Status: DC | PRN

## 2023-07-06 MED ORDER — OXYCODONE HCL 5 MG PO TABS: 10.0000 mg | ORAL_TABLET | Freq: Four times a day (QID) | ORAL | Status: DC | PRN

## 2023-07-06 MED ORDER — ACETAMINOPHEN 325 MG PO TABS
650.0000 mg | ORAL_TABLET | ORAL | Status: DC | PRN
  Administered 2023-07-06: 650 mg via ORAL
  Filled 2023-07-06: qty 2

## 2023-07-06 MED ORDER — EPHEDRINE 5 MG/ML INJ: 10.0000 mg | INTRAVENOUS | Status: DC | PRN

## 2023-07-06 MED ORDER — SOD CITRATE-CITRIC ACID 500-334 MG/5ML PO SOLN: 30.0000 mL | ORAL | Status: DC | PRN

## 2023-07-06 MED ORDER — OXYCODONE-ACETAMINOPHEN 5-325 MG PO TABS: 2.0000 | ORAL_TABLET | ORAL | Status: DC | PRN

## 2023-07-06 MED ORDER — NIFEDIPINE ER OSMOTIC RELEASE 30 MG PO TB24
30.0000 mg | ORAL_TABLET | Freq: Every day | ORAL | Status: DC
  Administered 2023-07-07 – 2023-07-08 (×2): 30 mg via ORAL
  Filled 2023-07-06 (×2): qty 1

## 2023-07-06 MED ORDER — LACTATED RINGERS IV SOLN: 500.0000 mL | Freq: Once | INTRAVENOUS | Status: DC

## 2023-07-06 MED ORDER — LABETALOL HCL 5 MG/ML IV SOLN: 40.0000 mg | INTRAVENOUS | Status: DC | PRN

## 2023-07-06 MED ORDER — ONDANSETRON HCL 4 MG PO TABS: 4.0000 mg | ORAL_TABLET | ORAL | Status: DC | PRN

## 2023-07-06 MED ORDER — HYDRALAZINE HCL 20 MG/ML IJ SOLN: 10.0000 mg | INTRAMUSCULAR | Status: DC | PRN

## 2023-07-06 MED ORDER — FENTANYL CITRATE (PF) 100 MCG/2ML IJ SOLN
50.0000 ug | INTRAMUSCULAR | Status: DC | PRN
  Administered 2023-07-06 (×2): 100 ug via INTRAVENOUS
  Filled 2023-07-06 (×2): qty 2

## 2023-07-06 MED ORDER — MISOPROSTOL 25 MCG QUARTER TABLET
25.0000 ug | ORAL_TABLET | ORAL | Status: DC | PRN
  Administered 2023-07-06: 25 ug via VAGINAL
  Filled 2023-07-06: qty 1

## 2023-07-06 MED ORDER — FENTANYL-BUPIVACAINE-NACL 0.5-0.125-0.9 MG/250ML-% EP SOLN
EPIDURAL | Status: DC | PRN
  Administered 2023-07-06: 12 mL/h via EPIDURAL

## 2023-07-06 MED ORDER — LACTATED RINGERS IV SOLN: INTRAVENOUS | Status: DC

## 2023-07-06 MED ORDER — LABETALOL HCL 5 MG/ML IV SOLN: 20.0000 mg | INTRAVENOUS | Status: DC | PRN

## 2023-07-06 MED ORDER — SIMETHICONE 80 MG PO CHEW: 80.0000 mg | CHEWABLE_TABLET | ORAL | Status: DC | PRN

## 2023-07-06 MED ORDER — WITCH HAZEL-GLYCERIN EX PADS: 1.0000 | MEDICATED_PAD | CUTANEOUS | Status: DC | PRN

## 2023-07-06 MED ORDER — ZOLPIDEM TARTRATE 5 MG PO TABS: 5.0000 mg | ORAL_TABLET | Freq: Every evening | ORAL | Status: DC | PRN

## 2023-07-06 MED ORDER — FENTANYL-BUPIVACAINE-NACL 0.5-0.125-0.9 MG/250ML-% EP SOLN
12.0000 mL/h | EPIDURAL | Status: DC | PRN
  Filled 2023-07-06: qty 250

## 2023-07-06 MED ORDER — OXYTOCIN-SODIUM CHLORIDE 30-0.9 UT/500ML-% IV SOLN
2.5000 [IU]/h | INTRAVENOUS | Status: DC
  Filled 2023-07-06: qty 500

## 2023-07-06 MED ORDER — BENZOCAINE-MENTHOL 20-0.5 % EX AERO: 1.0000 | INHALATION_SPRAY | CUTANEOUS | Status: DC | PRN

## 2023-07-06 MED ORDER — OXYCODONE-ACETAMINOPHEN 5-325 MG PO TABS: 1.0000 | ORAL_TABLET | ORAL | Status: DC | PRN

## 2023-07-06 MED ORDER — OXYCODONE HCL 5 MG PO TABS: 5.0000 mg | ORAL_TABLET | Freq: Four times a day (QID) | ORAL | Status: DC | PRN

## 2023-07-06 NOTE — Anesthesia Procedure Notes (Signed)
Epidural Patient location during procedure: OB Start time: 07/06/2023 4:16 PM End time: 07/06/2023 4:30 PM  Staffing Anesthesiologist: Trevor Iha, MD Performed: anesthesiologist   Preanesthetic Checklist Completed: patient identified, IV checked, site marked, risks and benefits discussed, surgical consent, monitors and equipment checked, pre-op evaluation and timeout performed  Epidural Patient position: sitting Prep: DuraPrep and site prepped and draped Patient monitoring: continuous pulse ox and blood pressure Approach: midline Location: L3-L4 Injection technique: LOR air  Needle:  Needle type: Tuohy  Needle gauge: 17 G Needle length: 9 cm and 9 Needle insertion depth: 8 cm Catheter type: closed end flexible Catheter size: 19 Gauge Catheter at skin depth: 13 cm Test dose: negative  Assessment Events: blood not aspirated, no cerebrospinal fluid, injection not painful, no injection resistance, no paresthesia and negative IV test  Additional Notes Patient identified. Risks/Benefits/Options discussed with patient including but not limited to bleeding, infection, nerve damage, paralysis, failed block, incomplete pain control, headache, blood pressure changes, nausea, vomiting, reactions to medication both or allergic, itching and postpartum back pain. Confirmed with bedside nurse the patient's most recent platelet count. Confirmed with patient that they are not currently taking any anticoagulation, have any bleeding history or any family history of bleeding disorders. Patient expressed understanding and wished to proceed. All questions were answered. Sterile technique was used throughout the entire procedure. Please see nursing notes for vital signs. Test dose was given through epidural needle and negative prior to continuing to dose epidural or start infusion. Warning signs of high block given to the patient including shortness of breath, tingling/numbness in hands, complete  motor block, or any concerning symptoms with instructions to call for help. Patient was given instructions on fall risk and not to get out of bed. All questions and concerns addressed with instructions to call with any issues.   1 Attempt (S) . Patient tolerated procedure well.

## 2023-07-06 NOTE — H&P (Signed)
HPI: 32 y.o. G3P1011 @ [redacted]w[redacted]d estimated gestational age (as dated by LMP c/w 6 week ultrasound) presents for induction of labor due to chronic HTN (well-controlled on medication).  Leakage of fluid:  No Vaginal bleeding:  No Contractions:  No Fetal movement:  Yes  Prenatal care has been provided by Dr. Katrinka Blazing. Connye Burkitt [facility]).  ROS:  Denies fevers, chills, chest pain, visual changes, SOB, RUQ/epigastric pain, N/V, dysuria, hematuria, or sudden onset/worsening bilateral LE or facial edema.  Pregnancy complicated by: Chronic HTN (Procardia XL 30mg  daily- last taken yesterday evening, ASA 81mg  daily) Anxiety/depression Obesity (BMI 38) Asthma (Albuterol PRN) Sickle cell trait (FOB unknown) Migraines (Follows with [facility], previously on Imitrex PRN) Empty sella (Follows with [facility]) RPR Titer 1:1 x 2 (Treponemal Abs negative x 2) Considering permanent sterilization  Prenatal Transfer Tool  Maternal Diabetes: No Genetic Screening: Declined Maternal Ultrasounds/Referrals: Normal Fetal Ultrasounds or other Referrals:  Other: Serial growth/BPP Maternal Substance Abuse:  No Significant Maternal Medications:  None Significant Maternal Lab Results: Group B Strep negative   Prenatal Labs Blood type:  O Positive Antibody screen:  Negative CBC:  H/H 11.4/34.2 Rubella: Immune RPR:  Non-reactive Hep B:  Negative Hep C:  Negative HIV:  Negative GC/CT:  Negative Glucola:  125.9 (WNL)  Immunizations: Tdap: Given parentally Flu: Received 9/9  Contraceptive plan: Considering tubal sterilization  OBHx:  OB History     Gravida  3   Para  1   Term  1   Preterm  0   AB  1   Living  1      SAB  1   IAB  0   Ectopic  0   Multiple  0   Live Births  1          PMHx: See above Meds:  PNV, Procardia XL 30mg  daily, ASA 91mg  daily Allergy:   Allergies  Allergen Reactions   Other Anaphylaxis, Other (See Comments) and Itching    Tree Nuts Only    Shellfish Allergy Anaphylaxis and Nausea And Vomiting   Pollen Extract Other (See Comments)    Runny nose - Seasonal Allergies   Iodinated Contrast Media Other (See Comments)   Iodine Other (See Comments)   SurgHx:  Past Surgical History:  Procedure Laterality Date   CHOLECYSTECTOMY N/A 01/04/2022   Procedure: LAPAROSCOPIC CHOLECYSTECTOMY;  Surgeon: Chevis Pretty III, MD;  Location: [facility] OR;  Service: General;  Laterality: N/A;   WISDOM TOOTH EXTRACTION     SocHx:   Denies Tobacco, ETOH, illicit drugs  O: BP 125/78   Pulse 76   Resp 16   Ht 5' 2.5" (1.588 m)   Wt 102 kg   BMI 40.48 kg/m  Gen. AAOx3, NAD CV.  Regular rate Resp. Normal work of breathing Abd. Appropriately gravid for gestation Extr.  No bilateral LE edema, no calf tenderness bilaterally SVE : 1/thick/-3   Serial growth [place]: 06/07/2023 [redacted]w[redacted]d EFW 2162g, 4 lbs 12 oz (24%), AAFV, Cephalic, anterior placenta   Weekly fetal testing: 07/01/2023, BPP 10/10 (NST reactive), fetus cephalic   Labs: see orders  A/P:  32 y.o. G3P1011 @ [redacted]w[redacted]d who is admitted for induction of labor for chronic HTN.  - Admit to L&D - Admit labs (CBC, T&S, RPR, Preeclampsia labs) - CEFM/Toco - Diet:  Light labor to Clear liquids - IVF:  LR at 125cc/hour - VTE Prophylaxis:  SCDs - GBS Status:  Negative - Presentation:  Confirm prior to IOL - Pain control:  Per patient request - Induction method:  Cytotec for cervical ripening. FB placed with 40 cc - Procardia XL ordered - Anticipate SVD  Kaybree Williams Autry-Lott, DO 07/06/2023, 9:02 AM

## 2023-07-06 NOTE — Progress Notes (Signed)
OB Progress Note  S: Patient coping well through contractions. She is not sure if she desires epidural or not. Consents to AROM and IUPC placement.  O: BP 129/86   Pulse 78   Temp 98.6 F (37 C) (Oral)   Resp 18   Ht 5' 2.5" (1.588 m)   Wt 102 kg   BMI 40.48 kg/m   FHT: 135bpm, moderate variablity, - accels, - decels Toco: appears q1-2 minutes but not graphing well SVE: 4.5/80/-3, sutures palpated, bloody show present, IUPC placed after AROM AROM: Clear, non-odorous  A/P: 32 y.o. G3P1011 @ [redacted]w[redacted]d admitted for induction of labor for chronic HTN.  FWB: Cat. I  Labor course: S/p vaginal Cytotec x 1 dose and s/p FB, IUPC placed, MVUs ~330  Pain: Per patient request GBS: Negative Anticipate SVD  Steva Ready, DO

## 2023-07-06 NOTE — Progress Notes (Signed)
At 1540 pt up to the bathroom escorted by fob.  Pt breathing well with uc's. When RN entered room at 1545, fhr was indeterminate and pt was sitting on the toilet with intense pain with uc's.  RN attempted to adjust cardio to detect fhr, but not successful, so had pt return to bed.  Fhr was noted to be in a prolonged decel.  Sve revealed 4/90/-1 and iv bolus started and Dr Connye Burkitt notified

## 2023-07-06 NOTE — Progress Notes (Signed)
OB Progress Note  S: In to see patient. Husband is at bedside. She is in the bathroom. Foley bulb placed by Dr. Salvadore Dom this AM. Vaginal cytotec also given.  O: BP 125/78   Pulse 76   Resp 16   Ht 5' 2.5" (1.588 m)   Wt 102 kg   BMI 40.48 kg/m   FHT: 145bpm, moderate variablity, + accels, - decels Toco: not graphing well, appears irregular SVE: 1/thick/-3 at   A/P: 32 y.o. G3P1011 @ [redacted]w[redacted]d admitted for induction of labor for chronic HTN.  FWB: Cat. I Labor course: S/p vaginal Cytotec and foley balloon placement at 0730 Pain: Per patient request GBS: Negative Blood pressures normotensive, home Procardia XL 30mg  daily ordered Preeclampsia labs unremarkable, PCR undetectable Anticipate SVD  Steva Ready, DO

## 2023-07-06 NOTE — Anesthesia Preprocedure Evaluation (Signed)
Anesthesia Evaluation  Patient identified by MRN, date of birth, ID band Patient awake    Reviewed: Allergy & Precautions, NPO status , Patient's Chart, lab work & pertinent test results  Airway Mallampati: II  TM Distance: >3 FB Neck ROM: Full    Dental no notable dental hx. (+) Teeth Intact, Loose   Pulmonary asthma    Pulmonary exam normal breath sounds clear to auscultation       Cardiovascular hypertension (cHtn), Normal cardiovascular exam Rhythm:Regular Rate:Normal     Neuro/Psych  Headaches  negative psych ROS   GI/Hepatic negative GI ROS, Neg liver ROS,,,  Endo/Other    Renal/GU negative Renal ROS     Musculoskeletal   Abdominal  (+) + obese (BMI 40.3)  Peds  Hematology Lab Results      Component                Value               Date                      WBC                      6.0                 07/06/2023                HGB                      11.7 (L)            07/06/2023                HCT                      33.3 (L)            07/06/2023                MCV                      86.5                07/06/2023                PLT                      219                 07/06/2023              Anesthesia Other Findings   Reproductive/Obstetrics (+) Pregnancy                             Anesthesia Physical Anesthesia Plan  ASA: 3  Anesthesia Plan: Epidural   Post-op Pain Management:    Induction:   PONV Risk Score and Plan:   Airway Management Planned:   Additional Equipment:   Intra-op Plan:   Post-operative Plan:   Informed Consent: I have reviewed the patients History and Physical, chart, labs and discussed the procedure including the risks, benefits and alternatives for the proposed anesthesia with the patient or authorized representative who has indicated his/her understanding and acceptance.       Plan Discussed with:   Anesthesia Plan  Comments: (38.1 wk G3P1 w Chtn and BMI of  40.5 for LEA)       Anesthesia Quick Evaluation

## 2023-07-07 LAB — CBC
HCT: 30.6 % — ABNORMAL LOW (ref 36.0–46.0)
Hemoglobin: 10.6 g/dL — ABNORMAL LOW (ref 12.0–15.0)
MCH: 30.1 pg (ref 26.0–34.0)
MCHC: 34.6 g/dL (ref 30.0–36.0)
MCV: 86.9 fL (ref 80.0–100.0)
Platelets: 196 10*3/uL (ref 150–400)
RBC: 3.52 MIL/uL — ABNORMAL LOW (ref 3.87–5.11)
RDW: 13.3 % (ref 11.5–15.5)
WBC: 11.6 10*3/uL — ABNORMAL HIGH (ref 4.0–10.5)
nRBC: 0 % (ref 0.0–0.2)

## 2023-07-07 MED ORDER — IBUPROFEN 600 MG PO TABS
600.0000 mg | ORAL_TABLET | Freq: Four times a day (QID) | ORAL | Status: DC
  Administered 2023-07-07 – 2023-07-08 (×6): 600 mg via ORAL
  Filled 2023-07-07 (×6): qty 1

## 2023-07-07 NOTE — Lactation Note (Signed)
This note was copied from a baby's chart. Lactation Consultation Note  Patient Name: Girl Paycen Tech [id number] Date: 07/07/2023 Age:32 hours Reason for consult: Initial assessment;Early term 37-38.6wks;Breastfeeding assistance (2 % weight loss + DAT , incompatibility) OA. P2,  As LC entered the room time for baby to feed.  LC offered to assist and LC checked the diaper, dry.  LC placed the baby STS on the right breast, football, and assisted mom to obtain the depth. Multiple swallows , increased with breast compressions and fed for 22 mins, released and the nipple was well rounded. Latch score 9. Per mom was comfortable with the feeding.  LC reviewed BF basics and importance of obtaining depth with every latch and work with the baby to open wide prior to latch.  LC reviewed the doc flow sheets with mom, WNL for age except the Bilirubin. Jaundice level increasing, and repeat at 1600 per parents.  LC recommended if the Bilirubin is still increasing at 4 pm to call for [facility] and a DEBP will be setup as preventive measure to enhance the milk coming in . LC recommended hand expressing too    Maternal Data - per mom has been leaking since 15 weeks.  Has patient been taught Hand Expression?: Yes Does the patient have breastfeeding experience prior to this delivery?: Yes How long did the patient breastfeed?: per mom 2 months , DL pumped x 2 months.   Feeding Mother's Current Feeding Choice: Breast Milk  LATCH Score Latch: Grasps breast easily, tongue down, lips flanged, rhythmical sucking.  Audible Swallowing: Spontaneous and intermittent  Type of Nipple: Everted at rest and after stimulation  Comfort (Breast/Nipple): Soft / non-tender  Hold (Positioning): Assistance needed to correctly position infant at breast and maintain latch.  LATCH Score: 9   Lactation Tools Discussed/Used    Interventions Interventions: Breast feeding basics reviewed;Assisted with latch;Skin to skin;Breast  massage;Hand express;Reverse pressure;Breast compression;Adjust position;Support pillows;Position options;Education;LC Services brochure  Discharge Pump: Personal;DEBP, per mom Spectra   Consult Status Consult Status: Follow-up Date: 07/07/23 Follow-up type: In-patient    Matilde Sprang Adalyne Lovick 07/07/2023, 3:00 PM

## 2023-07-07 NOTE — Progress Notes (Signed)
MOB was referred for history of depression/anxiety.  * Referral screened out by Clinical Social Worker because none of the following criteria appear to apply:  ~ History of anxiety/depression during this pregnancy, or of post-partum depression following prior delivery.  ~ Diagnosis of anxiety and/or depression within last 3 years  Per OB notes, MOB did not indicate any signs/symptoms during pregnancy.  OR  * MOB's symptoms currently being treated with medication and/or therapy.  Please contact the Clinical Social Worker if needs arise, by [facility] request, or if MOB scores greater than 9/yes to question 10 on [place] Postpartum Depression Screen.   Enos Fling, Theresia Majors Clinical Social Worker [phone]

## 2023-07-07 NOTE — Progress Notes (Signed)
POSTPARTUM PROGRESS NOTE  Post Partum Day 1  Subjective:  Jane Ellison is a 32 y.o. [id number] s/p VD at [redacted]w[redacted]d.  She reports she is doing well. No acute events overnight. She denies any problems with ambulating, voiding or po intake. Denies nausea or vomiting.  Pain is well controlled.  Lochia is appropriate.  Objective: Blood pressure 123/73, pulse 64, temperature 97.7 F (36.5 C), temperature source Oral, resp. rate 18, height 5' 2.5" (1.588 m), weight 102 kg, SpO2 97%, unknown if currently breastfeeding.  Physical Exam:  General: alert, cooperative and no distress Chest: no respiratory distress Heart:regular rate, distal pulses intact Abdomen: soft, nontender,  Uterine Fundus: firm, appropriately tender DVT Evaluation: No calf swelling or tenderness Extremities: No LE edema Skin: warm, dry  Recent Labs    07/06/23 0640  HGB 11.7*  HCT 33.3*    Assessment/Plan: Jane Ellison is a 32 y.o. G2X5284 s/p VD at [redacted]w[redacted]d   PPD#1 - Doing well  Routine postpartum care cHTN- BP well controlled on procardia 30 mg daily Contraception: planning interval tubal Feeding: Breast/bottle Dispo: Plan for discharge 9/26-9/27.   LOS: 1 day   [employer], DO 07/07/2023, 3:53 AM

## 2023-07-07 NOTE — Lactation Note (Signed)
This note was copied from a baby's chart. Lactation Consultation Note  Patient Name: Jane Ellison [id number] Date: 07/07/2023 Age:32 hours Reason for consult: Follow-up assessment;Infant weight loss;Early term 37-38.6wks (bilirubin the same at 1600) decreased from 7.9 to 7.6.  Baby awake , somewhat sluggish to start, mom hand expressed easily and spoon fed the baby 1 ml and she was more awake to feed,.  Mom latched the baby on the left breast, cross cradle and LC assisted with the depth. Increased swallows and per mom comfortable.  LC provided a hand pump and recommended due to the jaundice , prior to latching the 1st breast , steps for latching , including pres- pumping and reverse pressure due to areola edema.  Baby still feeding , start time 1616.  LC recommended spoon feeding any EBM for appetizer to perk the baby up to feed with cues and by 3 hours.    Maternal Data Has patient been taught Hand Expression?: Yes Does the patient have breastfeeding experience prior to this delivery?: Yes How long did the patient breastfeed?: per mom 2 months  Feeding Mother's Current Feeding Choice: Breast Milk  LATCH Score Latch: Grasps breast easily, tongue down, lips flanged, rhythmical sucking.  Audible Swallowing: Spontaneous and intermittent  Type of Nipple: Everted at rest and after stimulation  Comfort (Breast/Nipple): Soft / non-tender  Hold (Positioning): Assistance needed to correctly position infant at breast and maintain latch.  LATCH Score: 9   Lactation Tools Discussed/Used Tools: Pump;Flanges Flange Size: 21;24;Other (comment) (@21  F fit the best) Breast pump type: Manual Pump Education: Milk Storage;Setup, frequency, and cleaning  Interventions Interventions: Breast feeding basics reviewed;Assisted with latch;Skin to skin;Breast massage;Hand express;Pre-pump if needed;Reverse pressure;Breast compression;Adjust position;Support pillows;Position options;Hand  pump;Education;LC Services brochure  Discharge Pump: DEBP;Personal  Consult Status Consult Status: Follow-up Date: 07/08/23 Follow-up type: In-patient    [name] 07/07/2023, 5:19 PM

## 2023-07-07 NOTE — Anesthesia Postprocedure Evaluation (Signed)
Anesthesia Post Note  Patient: Jane Ellison  Procedure(s) Performed: AN AD HOC LABOR EPIDURAL     Patient location during evaluation: Mother Baby Anesthesia Type: Epidural Level of consciousness: awake and alert and oriented Pain management: satisfactory to patient Vital Signs Assessment: post-procedure vital signs reviewed and stable Respiratory status: respiratory function stable Cardiovascular status: stable Postop Assessment: no headache, no backache, epidural receding, patient able to bend at knees, no signs of nausea or vomiting, adequate PO intake and able to ambulate Anesthetic complications: no   No notable events documented.  Last Vitals:  Vitals:   07/07/23 0246 07/07/23 0515  BP: 123/73 129/79  Pulse:  62  Resp:  16  Temp:  36.7 C  SpO2:  100%    Last Pain:  Vitals:   07/07/23 0515  TempSrc: Oral  PainSc: 0-No pain   Pain Goal:                   [name]

## 2023-07-08 ENCOUNTER — Other Ambulatory Visit (HOSPITAL_COMMUNITY): Payer: Self-pay

## 2023-07-08 LAB — SURGICAL PATHOLOGY

## 2023-07-08 MED ORDER — ACETAMINOPHEN 325 MG PO TABS
650.0000 mg | ORAL_TABLET | ORAL | 0 refills | Status: AC | PRN
Start: 2023-07-08 — End: ?
  Filled 2023-07-08: qty 100, 9d supply, fill #0

## 2023-07-08 MED ORDER — IBUPROFEN 600 MG PO TABS
600.0000 mg | ORAL_TABLET | Freq: Four times a day (QID) | ORAL | 0 refills | Status: AC
  Filled 2023-07-08: qty 30, 8d supply, fill #0

## 2023-07-08 MED ORDER — NIFEDIPINE ER 30 MG PO TB24
30.0000 mg | ORAL_TABLET | Freq: Every day | ORAL | 0 refills | Status: DC
  Filled 2023-07-08: qty 30, 30d supply, fill #0

## 2023-07-08 NOTE — Discharge Summary (Signed)
Postpartum Discharge Summary  Date of Service: 07/08/23     Patient Name: Jane Ellison DOB: [birth year] MRN: [id number]  Date of admission: 07/06/2023 Delivery date:07/06/2023 Delivering provider: Steva Ready Date of discharge: 07/08/2023  Admitting diagnosis: Chronic hypertension affecting pregnancy [O10.919] Intrauterine pregnancy: [redacted]w[redacted]d     Secondary diagnosis:  Principal Problem:   Chronic hypertension affecting pregnancy  Additional problems: Anxiety/depression, Obesity (BMI 38), Asthma, Sickle cell trait, migraines, empty sella, RPR titer 1:1 x 2 (Treponemal Abs negative x 2)    Discharge diagnosis: Term Pregnancy Delivered and CHTN                                              Post partum procedures: None Augmentation: AROM, Cytotec, and IP Foley Complications: None  Hospital course: Induction of Labor With Vaginal Delivery   32 y.o. yo [id number] at [redacted]w[redacted]d was admitted to the hospital 07/06/2023 for induction of labor.  Indication for induction:  Chronic HTN .  Patient had an labor course complicated by nothing. Membrane Rupture Time/Date: 3:13 PM,07/06/2023  Delivery Method:Vaginal, Spontaneous Operative Delivery:N/A Episiotomy: None Lacerations:  None Details of delivery can be found in separate delivery note.  Patient had a postpartum course complicated by nothing. Patient is discharged home 07/08/23.  Newborn Data: Birth date:07/06/2023 Birth time:6:14 PM Gender:Female Living status:Living Apgars:9 ,9  Weight:2750 g  Magnesium Sulfate received: No BMZ received: No Rhophylac:N/A MMR:N/A T-DaP:Given prenatally Flu: Yes RSV Vaccine received: No Transfusion:No Immunizations administered: Immunization History  Administered Date(s) Administered   Influenza Split 08/26/2015    Physical exam  Vitals:   07/07/23 1658 07/07/23 1750 07/07/23 2154 07/08/23 0510  BP: (!) 147/91 (!) 145/88 134/87 117/70  Pulse: 68 64 69 85  Resp: 17  16 16   Temp:   98.7 F (37.1  C) 98.6 F (37 C)  TempSrc:   Oral Oral  SpO2: 100%  100% 99%  Weight:      Height:       General: alert, cooperative, and no distress Lochia: appropriate Uterine Fundus: firm Incision: N/A DVT Evaluation: No evidence of DVT seen on physical exam. No cords or calf tenderness. Labs: Lab Results  Component Value Date   WBC 11.6 (H) 07/07/2023   HGB 10.6 (L) 07/07/2023   HCT 30.6 (L) 07/07/2023   MCV 86.9 07/07/2023   PLT 196 07/07/2023      Latest Ref Rng & Units 07/06/2023    6:40 AM  CMP  Glucose 70 - 99 mg/dL 474   BUN 6 - 20 mg/dL 6   Creatinine 2.59 - 5.63 mg/dL 8.75   Sodium 643 - 329 mmol/L 136   Potassium 3.5 - 5.1 mmol/L 3.3   Chloride 98 - 111 mmol/L 103   CO2 22 - 32 mmol/L 21   Calcium 8.9 - 10.3 mg/dL 8.8   Total Protein 6.5 - 8.1 g/dL 6.2   Total Bilirubin 0.3 - 1.2 mg/dL 0.7   Alkaline Phos 38 - 126 U/L 161   AST 15 - 41 U/L 20   ALT 0 - 44 U/L 22    Edinburgh Score:    07/07/2023    5:15 AM  Edinburgh Postnatal Depression Scale Screening Tool  I have been able to laugh and see the funny side of things. 0  I have looked forward with enjoyment to things. 0  I have blamed myself unnecessarily when  things went wrong. 2  I have been anxious or worried for no good reason. 0  I have felt scared or panicky for no good reason. 0  Things have been getting on top of me. 1  I have been so unhappy that I have had difficulty sleeping. 0  I have felt sad or miserable. 0  I have been so unhappy that I have been crying. 0  The thought of harming myself has occurred to me. 0  Edinburgh Postnatal Depression Scale Total 3      After visit meds:  Allergies as of 07/08/2023       Reactions   Other Anaphylaxis, Other (See Comments), Itching   Tree Nuts Only   Shellfish Allergy Anaphylaxis, Nausea And Vomiting   Pollen Extract Other (See Comments)   Runny nose - Seasonal Allergies   Iodinated Contrast Media Other (See Comments)   Iodine Other (See Comments)         Medication List     STOP taking these medications    EXCEDRIN MIGRAINE PO   Liletta (52 MG) 20.1 MCG/DAY Iud IUD Generic drug: levonorgestrel   ondansetron 4 MG disintegrating tablet Commonly known as: Zofran ODT   oxyCODONE 5 MG immediate release tablet Commonly known as: Roxicodone       TAKE these medications    acetaminophen 325 MG tablet Commonly known as: Tylenol Take 2 tablets (650 mg total) by mouth every 4 (four) hours as needed (for pain scale < 4).   acetaZOLAMIDE 250 MG tablet Commonly known as: DIAMOX TAKE 2 TABLETS BY MOUTH 2 TIMES DAILY.   albuterol 108 (90 Base) MCG/ACT inhaler Commonly known as: VENTOLIN HFA Inhale 2 puffs into the lungs every 4 (four) hours as needed for wheezing or shortness of breath.   Auvi-Q 0.3 MG/0.3ML Soaj injection Generic drug: EPINEPHrine Inject 0.3 mLs (0.3 mg total) into the muscle as needed for anaphylaxis.   fluticasone 50 MCG/ACT nasal spray Commonly known as: FLONASE Place 1 spray into both nostrils daily.   ibuprofen 600 MG tablet Commonly known as: ADVIL Take 1 tablet (600 mg total) by mouth every 6 (six) hours. What changed:  when to take this reasons to take this   loratadine 10 MG tablet Commonly known as: CLARITIN Take 10 mg by mouth daily.   multivitamin with minerals Tabs tablet Take 1 tablet by mouth daily.   NIFEdipine 30 MG 24 hr tablet Commonly known as: ADALAT CC Take 1 tablet (30 mg total) by mouth daily. What changed:  medication strength how much to take   Nurtec 75 MG Tbdp Generic drug: Rimegepant Sulfate Take 1 tablet by mouth daily as needed (migraine).   triamcinolone cream 0.1 % Commonly known as: KENALOG Apply 1 application. topically daily as needed (eczema).         Discharge home in stable condition Infant Feeding: Breast Infant Disposition:home with mother Discharge instruction: per After Visit Summary and Postpartum booklet. Activity: Advance as  tolerated. Pelvic rest for 6 weeks.  Diet: routine diet Anticipated Birth Control:  Likely interval bilateral salpingectomy Postpartum Appointment:6 weeks Additional Postpartum F/U: Postpartum Depression checkup and BP check 1 week Future Appointments:No future appointments. Follow up Visit:  Follow-up Information     Steva Ready, DO Follow up in 1 week(s).   Specialty: Obstetrics and Gynecology Why: Please keep your 1 week blood pressure check and your 6 week postpartum visit as previously scheduled. Contact information: [address] [place] [address] [phone]  07/08/2023 Steva Ready, DO

## 2023-07-08 NOTE — Lactation Note (Signed)
This note was copied from a baby's chart. Lactation Consultation Note  Patient Name: Jane Ellison [id number] Date: 07/08/2023 Age:32 hours - Serum Bili at 0825 - 9.9  Reason for consult: Follow-up assessment;Infant weight loss;Early term 37-38.6wks (5 % weight loss,) Per mom the baby recently fed 20 mins , milk feels like its coming in.  LC reviewed the doc flow sheets , WNL for age.  LC reviewed the BF D/C teaching and the [facility] resources.  Per mom has a DEBP at home.     Maternal Data Has patient been taught Hand Expression?: Yes  Feeding Mother's Current Feeding Choice: Breast Milk  LATCH Score range 9-10     Lactation Tools Discussed/Used Tools: Pump;Flanges Flange Size: 21;24 Breast pump type: Manual Pump Education: Setup, frequency, and cleaning;Milk Storage  Interventions Interventions: Breast feeding basics reviewed;Hand pump;Education;LC Services brochure  Discharge Discharge Education: Engorgement and breast care;Warning signs for feeding baby Pump: Personal;DEBP;Manual  Consult Status Consult Status: Complete Date: 07/08/23    Kathrin Greathouse 07/08/2023, 10:17 AM

## 2023-08-02 ENCOUNTER — Telehealth (HOSPITAL_COMMUNITY): Payer: Self-pay

## 2023-08-02 NOTE — Telephone Encounter (Signed)
08/02/2023 1242  Name: Jane Ellison MRN: [id number] DOB: 10/14/1990  Reason for Call:  Transition of Care Hospital Discharge Call  Contact Status: Patient Contact Status: Complete  Language assistant needed: Interpreter Mode: Interpreter Not Needed        Follow-Up Questions: Do You Have Any Concerns About Your Health As You Heal From Delivery?: No Do You Have Any Concerns About Your Infants Health?: No  Edinburgh Postnatal Depression Scale:  In the Past 7 Days:    PHQ2-9 Depression Scale:     Discharge Follow-up: Edinburgh score requires follow up?:  (Patient states that she did EPDS with her doctor at her BP check. She states her score was a 2. Patient declines wanting to go through EPDS today.) Patient was advised of the following resources:: Breastfeeding Support Group, Support Group  Post-discharge interventions: Reviewed Newborn Safe Sleep Practices  Signature  Signe Colt

## 2023-08-17 DIAGNOSIS — Z30014 Encounter for initial prescription of intrauterine contraceptive device: Secondary | ICD-10-CM | POA: Diagnosis not present

## 2023-08-17 DIAGNOSIS — L2084 Intrinsic (allergic) eczema: Secondary | ICD-10-CM | POA: Diagnosis not present

## 2023-08-17 DIAGNOSIS — L219 Seborrheic dermatitis, unspecified: Secondary | ICD-10-CM | POA: Diagnosis not present

## 2023-09-30 DIAGNOSIS — Z3009 Encounter for other general counseling and advice on contraception: Secondary | ICD-10-CM | POA: Diagnosis not present

## 2023-09-30 IMAGING — US US ABDOMEN LIMITED
1 series · 14 of 25 positions shown · non-contrast
Comparison: None.

CLINICAL DATA: Right upper quadrant pain for 2 months. Rule out
gallstones.

EXAM:
ULTRASOUND ABDOMEN LIMITED RIGHT UPPER QUADRANT

[Series 1: us abdomen limited · 0.17mm/px · 14 of 52 slices shown]
[im 1/52]
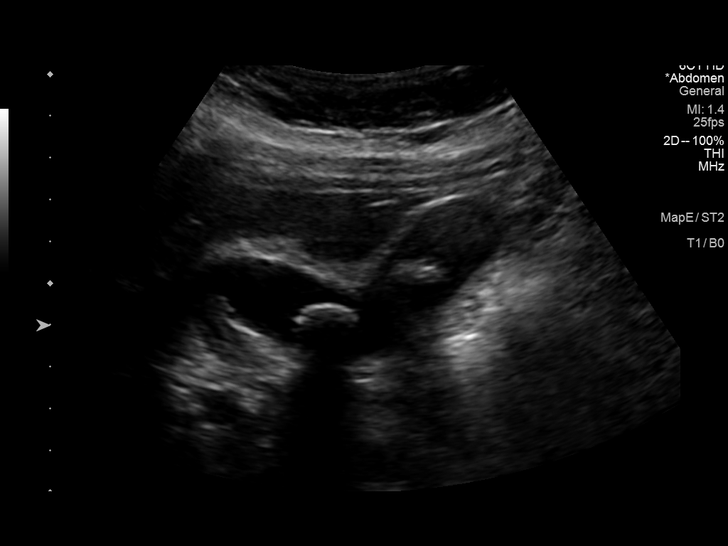
[im 5/52]
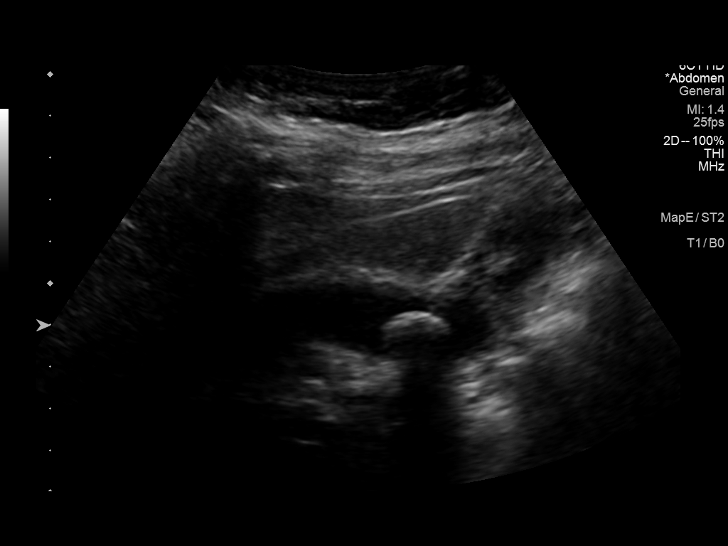
[im 9/52]
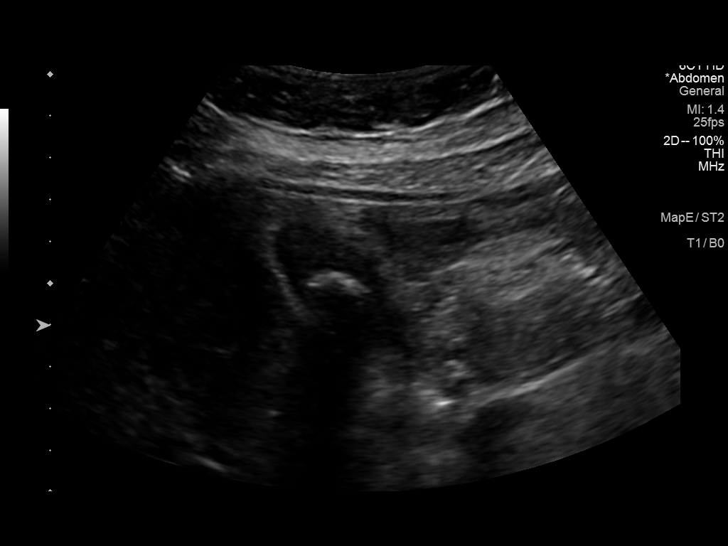
[im 13/52]
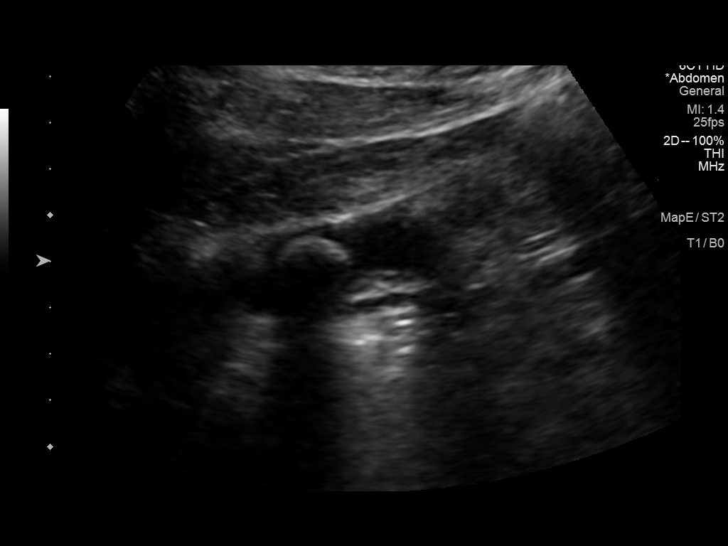
[im 18/52]
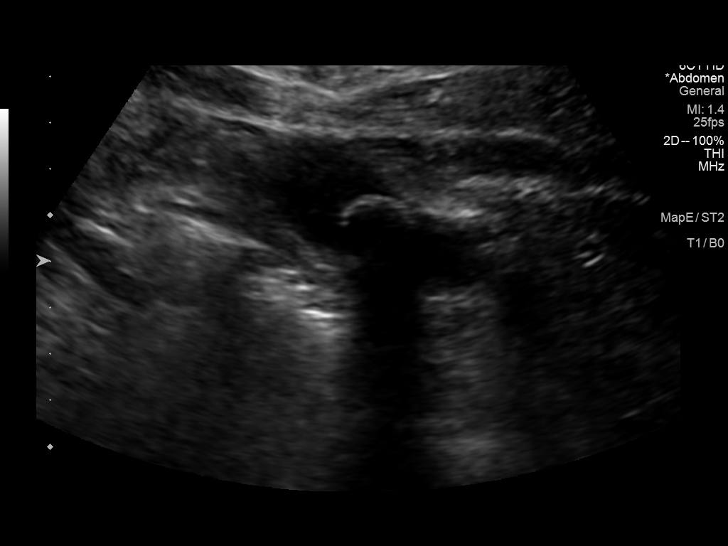
[im 20/52]
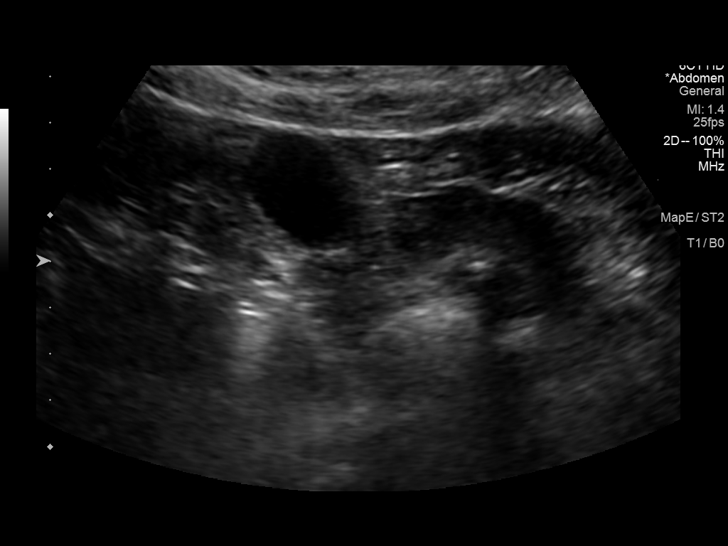
[im 24/52]
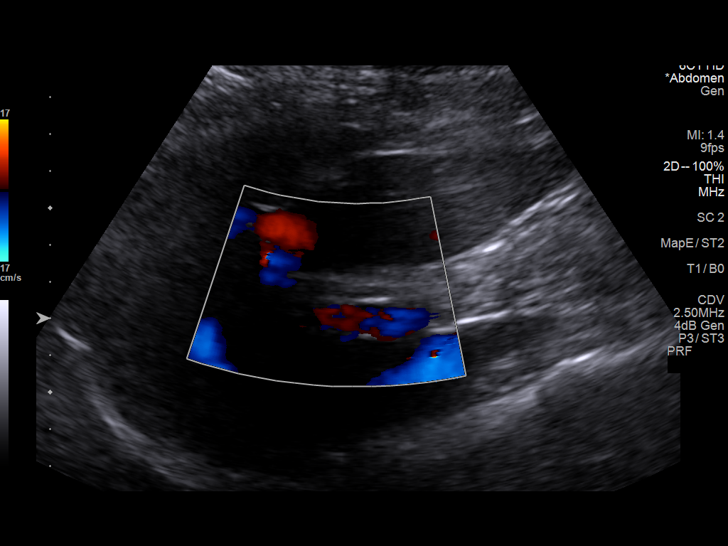
[im 28/52]
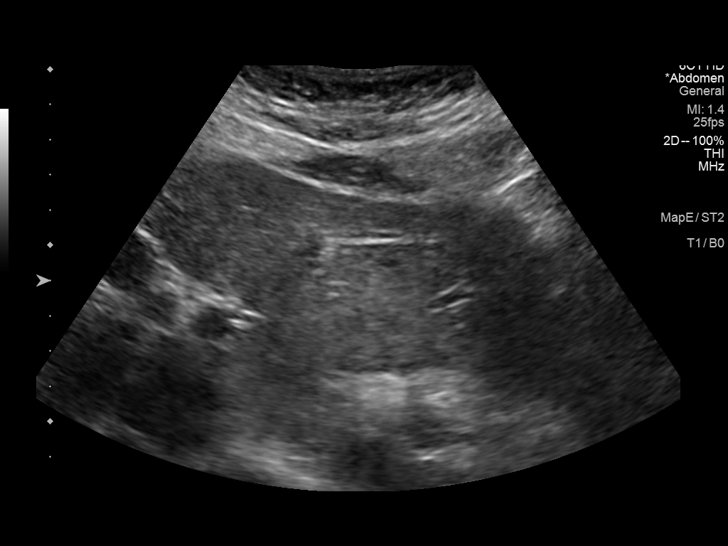
[im 32/52]
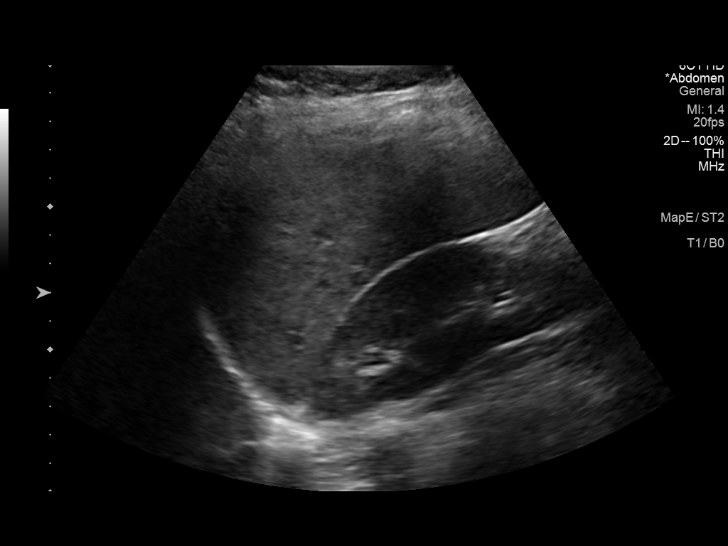
[im 35/52]
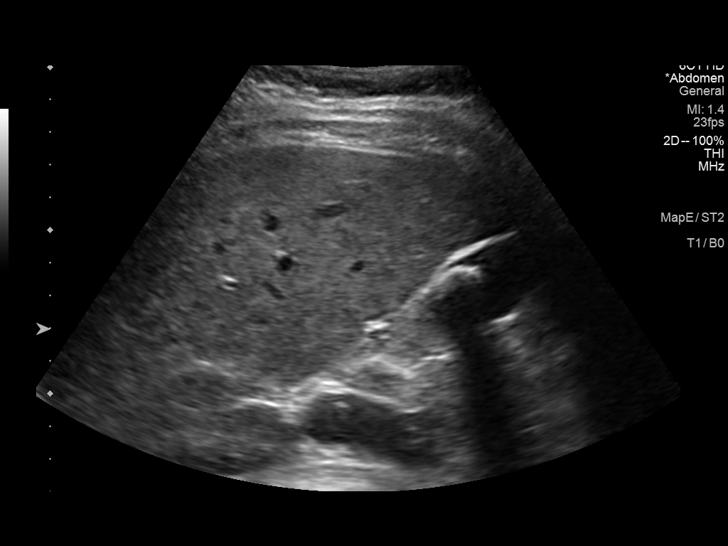
[im 39/52]
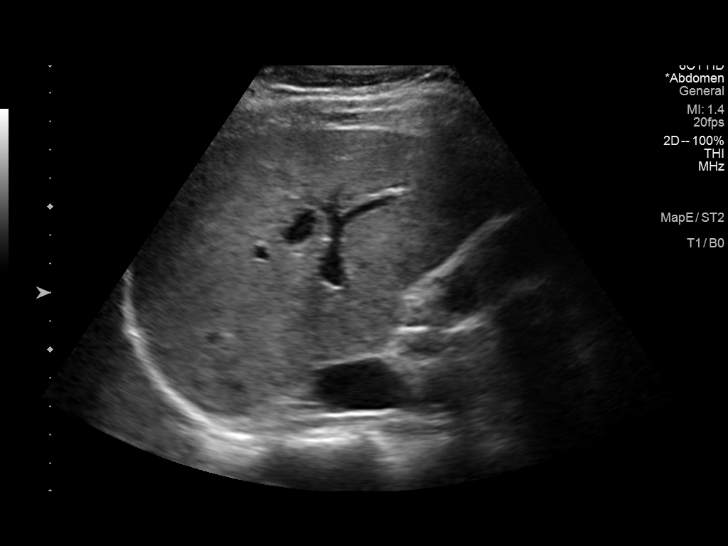
[im 43/52]
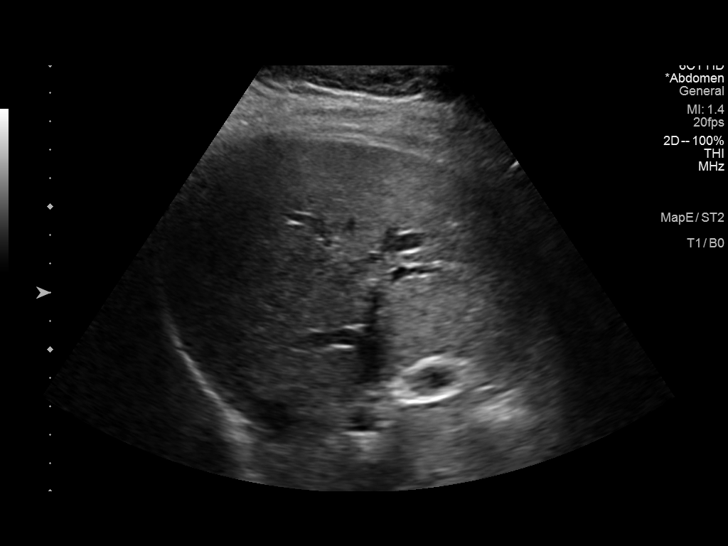
[im 47/52]
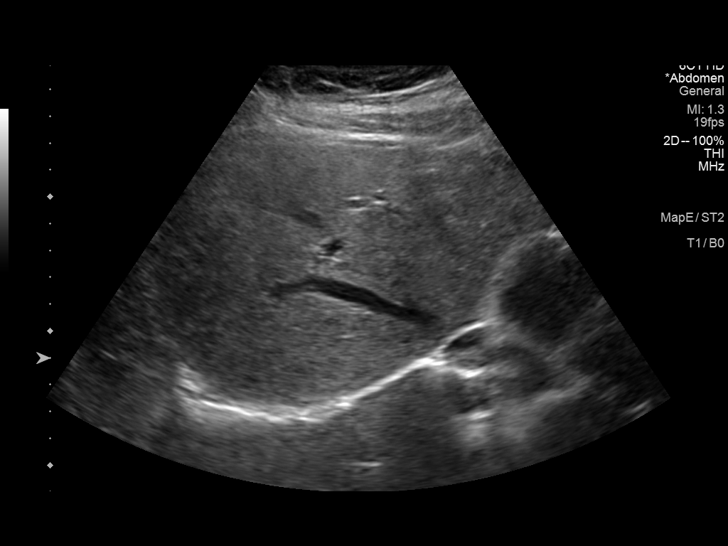
[im 52/52]
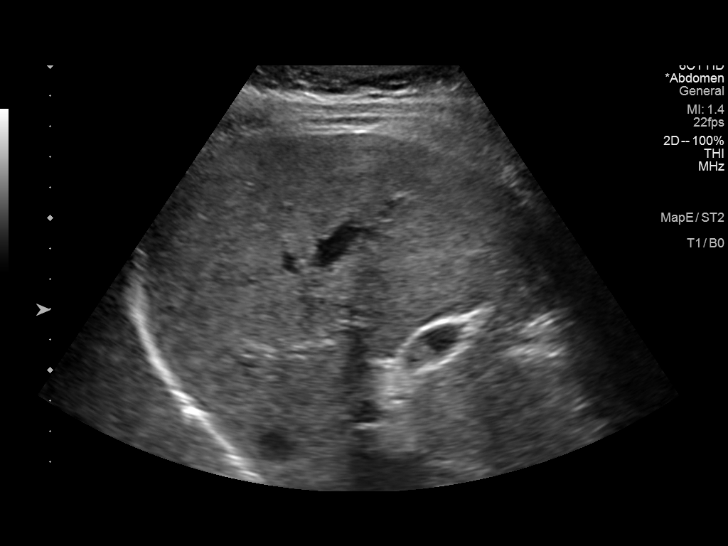

[14 of 25 positions shown; findings below may reference images not displayed]

FINDINGS: Gallbladder:

There are several shadowing layering stones in the gallbladder,
largest is 1.6 cm. There is no wall thickening, pericholecystic
fluid or positive sonographic Murphy's sign.

Common bile duct:

Diameter: 3.9 mm.  No intrahepatic biliary dilatation is seen.

Liver:

No focal lesion identified. There is mild increased hepatic
echogenicity consistent with steatosis. Portal vein is patent on
color Doppler imaging with normal direction of blood flow towards
the liver.

Other: None.
IMPRESSION: 1. Cholelithiasis without sonographic findings of acute
cholecystitis.
2. Increased hepatic echogenicity consistent with steatosis. No
focal lesion is seen through the steatosis.
3. No intrahepatic or extrahepatic biliary dilatation.

## 2023-11-09 ENCOUNTER — Other Ambulatory Visit: Payer: Self-pay | Admitting: Allergy

## 2023-11-09 ENCOUNTER — Ambulatory Visit (INDEPENDENT_AMBULATORY_CARE_PROVIDER_SITE_OTHER): Payer: 59 | Admitting: Allergy

## 2023-11-09 ENCOUNTER — Other Ambulatory Visit: Payer: Self-pay

## 2023-11-09 ENCOUNTER — Encounter: Payer: Self-pay | Admitting: Allergy

## 2023-11-09 VITALS — BP 130/88 | HR 80 | Temp 98.0°F | Resp 16 | Ht 62.5 in | Wt 215.5 lb

## 2023-11-09 DIAGNOSIS — J4520 Mild intermittent asthma, uncomplicated: Secondary | ICD-10-CM | POA: Diagnosis not present

## 2023-11-09 DIAGNOSIS — L2089 Other atopic dermatitis: Secondary | ICD-10-CM

## 2023-11-09 DIAGNOSIS — J302 Other seasonal allergic rhinitis: Secondary | ICD-10-CM | POA: Diagnosis not present

## 2023-11-09 DIAGNOSIS — J3089 Other allergic rhinitis: Secondary | ICD-10-CM

## 2023-11-09 DIAGNOSIS — H1013 Acute atopic conjunctivitis, bilateral: Secondary | ICD-10-CM

## 2023-11-09 DIAGNOSIS — T7800XD Anaphylactic reaction due to unspecified food, subsequent encounter: Secondary | ICD-10-CM | POA: Diagnosis not present

## 2023-11-09 MED ORDER — BEPOTASTINE BESILATE 1.5 % OP SOLN: 1.0000 [drp] | Freq: Two times a day (BID) | OPHTHALMIC | 2 refills | Status: DC | PRN

## 2023-11-09 MED ORDER — RYALTRIS 665-25 MCG/ACT NA SUSP: 2.0000 | Freq: Two times a day (BID) | NASAL | 2 refills | Status: AC | PRN

## 2023-11-09 MED ORDER — ALBUTEROL SULFATE HFA 108 (90 BASE) MCG/ACT IN AERS: 2.0000 | INHALATION_SPRAY | RESPIRATORY_TRACT | 1 refills | Status: DC | PRN

## 2023-11-09 MED ORDER — NEFFY 2 MG/0.1ML NA SOLN: 1.0000 | NASAL | 1 refills | Status: DC | PRN

## 2023-11-09 NOTE — Patient Instructions (Addendum)
Asthma: Daily controller medication(s): NONE Prior to physical activity: May use albuterol rescue inhaler 2 puffs 5 to 15 minutes prior to strenuous physical activities. Rescue medications: May use albuterol rescue inhaler 2 puffs or nebulizer every 4 to 6 hours as needed for shortness of breath, chest tightness, coughing, and wheezing. Monitor frequency of use.  Let me know if you are not meeting the below goals Lung function is normal today Asthma control goals:  Full participation in all desired activities (may need albuterol before activity) Albuterol use two times or less a week on average (not counting use with activity) Cough interfering with sleep two times or less a month Oral steroids no more than once a year No hospitalizations  Allergic rhinitis: Continue avoidance measures for grass, ragweed, weed, trees, mold, dust mites, cat, dog, horse, cockroach. Will obtain updated environmental allergy panel via blood work at this time Recommend use of Ryaltris nasal spray 2 sprays each nostril twice a day as needed for runny or stuffy nose. With using nasal sprays point tip of bottle toward eye on same side nostril and lean head slightly forward for best technique.   Recommend use of Allegra 20 mg daily as needed.  May need to alternate if effective between Allegra and Claritin Recommended for pre-1 drop each eye twice daily as needed for itchy watery eyes Consider completion of allergen immunotherapy for better symptom control  Food allergy: Continue avoidance of tree nuts and shellfish Will obtain updated testing via IgE blood work for tree nuts and shellfish as well as select fruits You do appear to have a component of oral allergy syndrome (see below)  Have access to epinephrine (injectable epinephrine or nasally administered) at all times.  We have sent in the Neffy and nasal device for you   Follow emergency action plan in case of allergic reaction  The oral allergy syndrome  (OAS) or pollen-food allergy syndrome (PFAS) is a relatively common form of food allergy, particularly in adults. It typically occurs in people who have pollen allergies when the immune system "sees" proteins on the food that look like proteins on the pollen. This results in the allergy antibody (IgE) binding to the food instead of the pollen. Patients typically report itching and/or mild swelling of the mouth and throat immediately following ingestion of certain uncooked fruits (including nuts) or raw vegetables. Only a very small number of affected individuals experience systemic allergic reactions, such as anaphylaxis which occurs with true food allergies.      Eczema Continue daily moisturization especially after bathing Continue as needed use of the topical steroid creams when needed  Follow up in 4-6 month or sooner if needed

## 2023-11-09 NOTE — Progress Notes (Unsigned)
New Patient Note  RE: Jane Ellison MRN: [id number] DOB: [birth year] Date of Office Visit: 11/09/2023  Primary care provider: Carilyn Goodpasture, NP  Chief Complaint: Allergy update  History of present illness: Jane Ellison is a 33 y.o. female presenting today for multiple allergies. Discussed the use of AI scribe software for clinical note transcription with the patient, who gave verbal consent to proceed.  History of Present Illness   The patient presents for an allergy evaluation and testing.  In 2022, she experienced an allergic reaction in the [place], characterized by facial swelling and lip enlargement after consuming a chicken meal. Despite known allergies to shellfish and peanuts, the reaction was unexpected. Benadryl was effective in reducing the swelling, and epinephrine was not required. She currently does not have an EpiPen and is cautious about her diet.  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 She can consume fish but not tree nuts, except for peanuts and peanut butter. She has a history of pollen allergies and has previously undergone allergy shots, reaching the red vial stage before experiencing a reaction. She currently takes Xyzal and Flonase for allergy management, having switched from Claritin due to decreased effectiveness. She has not tried Careers adviser.  She has a history of asthma since childhood, triggered by cold weather and exercise. She manages symptoms by monitoring her activity and using a rescue inhaler as needed. She has not been on a maintenance inhaler or montelukast.  She experiences eczema flares with seasonal changes, currently having a flare on her hand. She uses triamcinolone, betamethasone, and clobetasol for management, along with hydrocortisone for milder symptoms.          Review of systems: 10pt ROS negative unless noted above in  HPI  All other systems negative unless noted above in HPI  Past medical history: Past Medical History:  Diagnosis Date   Angio-edema    Asthma    rarely uses inhaler   Eczema    Head ache    History of pre-eclampsia in prior pregnancy, currently pregnant    Hyperlipidemia    no meds, diet controlled   Pre-eclampsia    Seasonal allergies     Past surgical history: Past Surgical History:  Procedure Laterality Date   CHOLECYSTECTOMY N/A 01/04/2022   Procedure: LAPAROSCOPIC CHOLECYSTECTOMY;  Surgeon: Griselda Miner, MD;  Location: [facility] OR;  Service: General;  Laterality: N/A;   WISDOM TOOTH EXTRACTION      Family history:  Family History  Problem Relation Age of Onset   Hypertension Mother    Allergy (severe) Mother        Grass   Hypertension Maternal Grandmother    Hyperlipidemia Maternal Grandmother    Diabetes Maternal Grandmother    Hypertension Maternal Grandfather    Hyperlipidemia Maternal Grandfather    Diabetes Maternal Grandfather    Hypertension Paternal Grandmother    Hypertension Paternal Grandfather    Allergic rhinitis Neg Hx    Angioedema Neg Hx    Asthma Neg Hx    Atopy Neg Hx    Eczema Neg Hx    Immunodeficiency Neg Hx    Urticaria Neg Hx     Social history: Social History   Socioeconomic History   Marital status: Married    Spouse name: Delrus   Number of children: 1   Years of education: Not on file   Highest education level: Not on file  Occupational History   Not  on file  Tobacco Use   Smoking status: Never   Smokeless tobacco: Never  Vaping Use   Vaping status: Never Used  Substance and Sexual Activity   Alcohol use: Yes    Alcohol/week: 1.0 - 2.0 standard drink of alcohol    Types: 1 - 2 Glasses of wine per week   Drug use: Never   Sexual activity: Yes    Birth control/protection: None    Comment: pregnant  Other Topics Concern   Not on file  Social History Narrative   Not on file   Social Drivers of Health   Financial  Resource Strain: Not on file  Food Insecurity: No Food Insecurity (07/06/2023)   Hunger Vital Sign    Worried About Running Out of Food in the Last Year: Never true    Ran Out of Food in the Last Year: Never true  Transportation Needs: No Transportation Needs (07/06/2023)   PRAPARE - Administrator, Civil Service (Medical): No    Lack of Transportation (Non-Medical): No  Physical Activity: Not on file  Stress: Not on file  Social Connections: Not on file  Intimate Partner Violence: Not At Risk (07/06/2023)   Humiliation, Afraid, Rape, and Kick questionnaire    Fear of Current or Ex-Partner: No    Emotionally Abused: No    Physically Abused: No    Sexually Abused: No    Medication List: Current Outpatient Medications  Medication Sig Dispense Refill   acetaminophen (TYLENOL) 325 MG tablet Take 2 tablets (650 mg total) by mouth every 4 (four) hours as needed (for pain scale < 4). 100 tablet 0   albuterol (PROVENTIL HFA;VENTOLIN HFA) 108 (90 Base) MCG/ACT inhaler Inhale 2 puffs into the lungs every 4 (four) hours as needed for wheezing or shortness of breath. 18 g 1   AUVI-Q 0.3 MG/0.3ML SOAJ injection Inject 0.3 mLs (0.3 mg total) into the muscle as needed for anaphylaxis. 2 Device 1   fluticasone (FLONASE) 50 MCG/ACT nasal spray Place 1 spray into both nostrils daily.     ibuprofen (ADVIL) 600 MG tablet Take 1 tablet (600 mg total) by mouth every 6 (six) hours. 30 tablet 0   loratadine (CLARITIN) 10 MG tablet Take 10 mg by mouth daily.     Multiple Vitamin (MULTIVITAMIN WITH MINERALS) TABS tablet Take 1 tablet by mouth daily.     NURTEC 75 MG TBDP Take 1 tablet by mouth daily as needed (migraine).     triamcinolone cream (KENALOG) 0.1 % Apply 1 application. topically daily as needed (eczema).  6   acetaZOLAMIDE (DIAMOX) 250 MG tablet TAKE 2 TABLETS BY MOUTH 2 TIMES DAILY. 120 tablet 2   NIFEdipine (ADALAT CC) 30 MG 24 hr tablet Take 1 tablet (30 mg total) by mouth daily. 30  tablet 0   No current facility-administered medications for this visit.    Known medication allergies: Allergies  Allergen Reactions   Other Anaphylaxis, Other (See Comments) and Itching    Tree Nuts Only   Shellfish Allergy Anaphylaxis and Nausea And Vomiting   Pollen Extract Other (See Comments)    Runny nose - Seasonal Allergies   Iodinated Contrast Media Other (See Comments)   Iodine Other (See Comments)     Physical examination: Blood pressure 130/88, pulse 80, temperature 98 F (36.7 C), temperature source Temporal, resp. rate 16, height 5' 2.5" (1.588 m), weight 215 lb 8 oz (97.8 kg), SpO2 97%, unknown if currently breastfeeding.  General: Alert, interactive, in no  acute distress. HEENT: PERRLA, TMs pearly gray, turbinates moderately edematous without discharge, post-pharynx non erythematous. Neck: Supple without lymphadenopathy. Lungs: Clear to auscultation without wheezing, rhonchi or rales. {no increased work of breathing. CV: Normal S1, S2 without murmurs. Abdomen: Nondistended, nontender. Skin: Warm and dry, without lesions or rashes. Extremities:  No clubbing, cyanosis or edema. Neuro:   Grossly intact.  Diagnositics/Labs:  Spirometry: {Blank single:19197::"results normal","FEV1: ***, FVC: ***, ratio consistent with ***"}   Assessment and plan: Asthma: Daily controller medication(s): NONE Prior to physical activity: May use albuterol rescue inhaler 2 puffs 5 to 15 minutes prior to strenuous physical activities. Rescue medications: May use albuterol rescue inhaler 2 puffs or nebulizer every 4 to 6 hours as needed for shortness of breath, chest tightness, coughing, and wheezing. Monitor frequency of use.  Let me know if you are not meeting the below goals Lung function is normal today Asthma control goals:  Full participation in all desired activities (may need albuterol before activity) Albuterol use two times or less a week on average (not counting use with  activity) Cough interfering with sleep two times or less a month Oral steroids no more than once a year No hospitalizations  Allergic rhinitis: Continue avoidance measures for grass, ragweed, weed, trees, mold, dust mites, cat, dog, horse, cockroach. Will obtain updated environmental allergy panel via blood work at this time Recommend use of Ryaltris nasal spray 2 sprays each nostril twice a day as needed for runny or stuffy nose. With using nasal sprays point tip of bottle toward eye on same side nostril and lean head slightly forward for best technique.   Recommend use of Allegra 20 mg daily as needed.  May need to alternate if effective between Allegra and Claritin Recommended for pre-1 drop each eye twice daily as needed for itchy watery eyes Consider completion of allergen immunotherapy for better symptom control  Food allergy: Continue avoidance of tree nuts and shellfish Will obtain updated testing via IgE blood work for tree nuts and shellfish as well as select fruits You do appear to have a component of oral allergy syndrome (see below)  Have access to epinephrine (injectable epinephrine or nasally administered) at all times.  We have sent in the Neffy and nasal device for you   Follow emergency action plan in case of allergic reaction  The oral allergy syndrome (OAS) or pollen-food allergy syndrome (PFAS) is a relatively common form of food allergy, particularly in adults. It typically occurs in people who have pollen allergies when the immune system "sees" proteins on the food that look like proteins on the pollen. This results in the allergy antibody (IgE) binding to the food instead of the pollen. Patients typically report itching and/or mild swelling of the mouth and throat immediately following ingestion of certain uncooked fruits (including nuts) or raw vegetables. Only a very small number of affected individuals experience systemic allergic reactions, such as anaphylaxis which  occurs with true food allergies.    Eczema Continue daily moisturization especially after bathing Continue as needed use of the topical steroid creams when needed  Follow up in 4-6 month or sooner if needed   I appreciate the opportunity to take part in [place] care. Please do not hesitate to contact me with questions.  Sincerely,   Margo Aye, MD Allergy/Immunology [facility]

## 2023-11-10 ENCOUNTER — Other Ambulatory Visit (HOSPITAL_COMMUNITY): Payer: Self-pay

## 2023-11-10 MED ORDER — BEPOTASTINE BESILATE 1.5 % OP SOLN
1.0000 [drp] | Freq: Two times a day (BID) | OPHTHALMIC | 2 refills | Status: DC | PRN
  Filled 2023-11-10: qty 10, 50d supply, fill #0

## 2023-11-10 MED ORDER — NEFFY 2 MG/0.1ML NA SOLN: 1.0000 | NASAL | 1 refills | Status: DC | PRN

## 2023-11-10 MED ORDER — EPINEPHRINE 0.3 MG/0.3ML IJ SOAJ
0.3000 mg | INTRAMUSCULAR | 1 refills | Status: AC | PRN
  Filled 2023-11-10: qty 2, 30d supply, fill #0

## 2023-11-10 MED ORDER — ALBUTEROL SULFATE HFA 108 (90 BASE) MCG/ACT IN AERS
2.0000 | INHALATION_SPRAY | Freq: Four times a day (QID) | RESPIRATORY_TRACT | 2 refills | Status: AC | PRN
  Filled 2023-11-10: qty 6.7, 25d supply, fill #0

## 2023-11-11 ENCOUNTER — Other Ambulatory Visit (HOSPITAL_COMMUNITY): Payer: Self-pay

## 2023-11-11 ENCOUNTER — Other Ambulatory Visit: Payer: Self-pay | Admitting: Allergy

## 2023-11-11 MED ORDER — KETOTIFEN FUMARATE 0.035 % OP SOLN
1.0000 [drp] | Freq: Two times a day (BID) | OPHTHALMIC | 1 refills | Status: DC | PRN
  Filled 2023-11-11: qty 30, 300d supply, fill #0
  Filled 2023-11-11: qty 5, 50d supply, fill #0

## 2023-11-11 MED ORDER — KETOTIFEN FUMARATE 0.035 % OP SOLN
1.0000 [drp] | Freq: Two times a day (BID) | OPHTHALMIC | 5 refills | Status: AC | PRN
  Filled 2023-11-11 – 2024-05-18 (×2): qty 10, 100d supply, fill #0

## 2023-11-11 NOTE — Telephone Encounter (Signed)
Called and left a message for patient to call our office back to inform her of the medication change and schedule her follow up for May with Dr. Delorse Lek or a nurse practitioner.

## 2023-11-11 NOTE — Addendum Note (Signed)
Addended by: Orson Aloe on: 11/11/2023 09:48 AM   Modules accepted: Orders

## 2023-11-11 NOTE — Telephone Encounter (Signed)
Zaditor eye drops have been sent into [facility] per Dr. Delorse Lek.

## 2023-11-11 NOTE — Addendum Note (Signed)
Addended by: Briant Cedar L on: 11/11/2023 09:00 AM   Modules accepted: Orders

## 2023-11-12 LAB — IGE NUT PROF. W/COMPONENT RFLX

## 2023-11-13 LAB — ALLERGENS W/TOTAL IGE AREA 2
Alternaria Alternata IgE: 0.12 kU/L — AB
Aspergillus Fumigatus IgE: 0.23 kU/L — AB
Bermuda Grass IgE: 1.9 kU/L — AB
Cat Dander IgE: 13.8 kU/L — AB
Cedar, Mountain IgE: 3.83 kU/L — AB
Cladosporium Herbarum IgE: 0.18 kU/L — AB
Cockroach, German IgE: 1.13 kU/L — AB
Common Silver Birch IgE: 8.25 kU/L — AB
Cottonwood IgE: 0.33 kU/L — AB
D Pteronyssinus IgE: 99.7 kU/L — AB
Dog Dander IgE: 7.57 kU/L — AB
Elm, American IgE: 0.66 kU/L — AB
IgE (Immunoglobulin E), Serum: 925 [IU]/mL — ABNORMAL HIGH (ref 6–495)
Johnson Grass IgE: 5.83 kU/L — AB
Maple/Box Elder IgE: 0.32 kU/L — AB
Mouse Urine IgE: 4.78 kU/L — AB
Oak, White IgE: 10.3 kU/L — AB
Pecan, Hickory IgE: 7.19 kU/L — AB
Penicillium Chrysogen IgE: 0.12 kU/L — AB
Pigweed, Rough IgE: 0.78 kU/L — AB
Ragweed, Short IgE: 23.5 kU/L — AB
Sheep Sorrel IgE Qn: 0.2 kU/L — AB
Timothy Grass IgE: 9.36 kU/L — AB
White Mulberry IgE: 0.12 kU/L — AB

## 2023-11-13 LAB — PEANUT COMPONENTS
F352-IgE Ara h 8: 1.04 kU/L — AB
F422-IgE Ara h 1: <0.1 kU/L
F423-IgE Ara h 2: 0.11 kU/L — AB
F424-IgE Ara h 3: <0.1 kU/L
F427-IgE Ara h 9: <0.1 kU/L
F447-IgE Ara h 6: <0.1 kU/L

## 2023-11-13 LAB — ALLERGEN PROFILE, SHELLFISH
Clam IgE: 0.53 kU/L — AB
F023-IgE Crab: 1.62 kU/L — AB
F080-IgE Lobster: 1.45 kU/L — AB
F290-IgE Oyster: 0.26 kU/L — AB
Scallop IgE: 0.71 kU/L — AB
Shrimp IgE: 1.33 kU/L — AB

## 2023-11-13 LAB — IGE NUT PROF. W/COMPONENT RFLX
F017-IgE Hazelnut (Filbert): 6.02 kU/L — AB
F018-IgE Brazil Nut: 0.12 kU/L — AB
F202-IgE Cashew Nut: 0.17 kU/L — AB
F202-IgE Cashew Nut: 2.17 kU/L — AB
F256-IgE Walnut: 5.94 kU/L — AB
Jug R 3 IgE: 2.34 kU/L — AB
Macadamia Nut, IgE: 0.44 kU/L — AB
Peanut, IgE: 0.23 kU/L — AB
Pecan Nut IgE: 1.21 kU/L — AB

## 2023-11-13 LAB — PANEL 604726
Cor A 1 IgE: 5.12 kU/L — AB
Cor A 14 IgE: 0.18 kU/L — AB
Cor A 8 IgE: <0.1 kU/L
Cor A 9 IgE: 0.63 kU/L — AB

## 2023-11-13 LAB — ALLERGEN PEACH F95: Allergen, Peach f95: 0.85 kU/L — AB

## 2023-11-13 LAB — ALLERGEN, PINEAPPLE, F210: Pineapple IgE: 0.15 kU/L — AB

## 2023-11-13 LAB — ALLERGEN COMPONENT COMMENTS

## 2023-11-13 LAB — PANEL 604350: Ber E 1 IgE: <0.1 kU/L

## 2023-11-13 LAB — ALLERGEN WATERMELON: Allergen Watermelon IgE: 0.17 kU/L — AB

## 2023-11-13 LAB — PANEL 604721
Jug R 1 IgE: 1.8 kU/L — AB
Jug R 3 IgE: <0.1 kU/L

## 2023-11-13 LAB — PANEL 604239: ANA O 3 IgE: 2.82 kU/L — AB

## 2023-11-13 LAB — F255-IGE PLUM: F255-IgE Plum: 0.13 kU/L — AB

## 2023-11-13 LAB — TRYPTASE: Tryptase: 3.8 ug/L (ref 2.2–13.2)

## 2023-11-14 ENCOUNTER — Other Ambulatory Visit: Payer: Self-pay

## 2023-11-15 ENCOUNTER — Other Ambulatory Visit: Payer: Self-pay

## 2023-11-16 ENCOUNTER — Encounter: Payer: Self-pay | Admitting: Allergy

## 2023-12-05 ENCOUNTER — Other Ambulatory Visit (HOSPITAL_COMMUNITY): Payer: Self-pay

## 2023-12-05 DIAGNOSIS — G43009 Migraine without aura, not intractable, without status migrainosus: Secondary | ICD-10-CM | POA: Diagnosis not present

## 2023-12-05 DIAGNOSIS — O165 Unspecified maternal hypertension, complicating the puerperium: Secondary | ICD-10-CM | POA: Diagnosis not present

## 2023-12-05 DIAGNOSIS — Z13220 Encounter for screening for lipoid disorders: Secondary | ICD-10-CM | POA: Diagnosis not present

## 2023-12-05 DIAGNOSIS — Z0000 Encounter for general adult medical examination without abnormal findings: Secondary | ICD-10-CM | POA: Diagnosis not present

## 2023-12-05 DIAGNOSIS — F418 Other specified anxiety disorders: Secondary | ICD-10-CM | POA: Diagnosis not present

## 2023-12-05 DIAGNOSIS — J309 Allergic rhinitis, unspecified: Secondary | ICD-10-CM | POA: Diagnosis not present

## 2023-12-05 MED ORDER — NURTEC 75 MG PO TBDP
75.0000 mg | ORAL_TABLET | ORAL | 2 refills | Status: AC
  Filled 2023-12-05: qty 15, 30d supply, fill #0

## 2023-12-05 MED ORDER — NIFEDIPINE ER OSMOTIC RELEASE 60 MG PO TB24
60.0000 mg | ORAL_TABLET | Freq: Every day | ORAL | 1 refills | Status: AC
  Filled 2023-12-05: qty 90, 90d supply, fill #0
  Filled 2024-05-18: qty 90, 90d supply, fill #1

## 2023-12-12 ENCOUNTER — Other Ambulatory Visit: Payer: Self-pay | Admitting: Allergy

## 2023-12-13 ENCOUNTER — Other Ambulatory Visit: Payer: Self-pay | Admitting: Allergy

## 2023-12-20 ENCOUNTER — Other Ambulatory Visit (HOSPITAL_COMMUNITY): Payer: Self-pay

## 2024-01-02 ENCOUNTER — Other Ambulatory Visit: Payer: Self-pay

## 2024-01-02 MED ORDER — OPZELURA 1.5 % EX CREA: 1.0000 | TOPICAL_CREAM | Freq: Every day | CUTANEOUS | Status: DC | PRN

## 2024-01-02 NOTE — Progress Notes (Signed)
 Medication Samples have been provided to the patient.  Drug name: Opzelura       Strength: 1.5%        Qty: 1  LOT: [id number]  Exp.Date: 11/10/2024  Dosing instructions: One application daily PRN  The patient has been instructed regarding the correct time, dose, and frequency of taking this medication, including desired effects and most common side effects.   Jane Ellison 4:22 PM 01/02/2024

## 2024-01-03 ENCOUNTER — Other Ambulatory Visit: Payer: Self-pay

## 2024-01-03 ENCOUNTER — Other Ambulatory Visit (HOSPITAL_COMMUNITY): Payer: Self-pay

## 2024-01-03 MED ORDER — OPZELURA 1.5 % EX CREA
1.0000 | TOPICAL_CREAM | Freq: Two times a day (BID) | CUTANEOUS | 3 refills | Status: DC | PRN
  Filled 2024-01-03: qty 60, fill #0

## 2024-01-04 ENCOUNTER — Other Ambulatory Visit: Payer: Self-pay

## 2024-01-04 ENCOUNTER — Other Ambulatory Visit (HOSPITAL_COMMUNITY): Payer: Self-pay

## 2024-01-05 ENCOUNTER — Other Ambulatory Visit: Payer: Self-pay

## 2024-01-05 ENCOUNTER — Other Ambulatory Visit (HOSPITAL_COMMUNITY): Payer: Self-pay

## 2024-01-05 MED ORDER — OPZELURA 1.5 % EX CREA: 1.0000 | TOPICAL_CREAM | Freq: Two times a day (BID) | CUTANEOUS | 3 refills | Status: AC | PRN

## 2024-01-06 ENCOUNTER — Other Ambulatory Visit (HOSPITAL_COMMUNITY): Payer: Self-pay

## 2024-01-10 DIAGNOSIS — R748 Abnormal levels of other serum enzymes: Secondary | ICD-10-CM | POA: Diagnosis not present

## 2024-01-11 ENCOUNTER — Telehealth: Payer: Self-pay

## 2024-01-11 ENCOUNTER — Other Ambulatory Visit (HOSPITAL_COMMUNITY): Payer: Self-pay

## 2024-01-11 NOTE — Telephone Encounter (Signed)
 The request has been approved. The authorization is effective from 01/11/2024 to 04/11/2024,

## 2024-01-11 NOTE — Telephone Encounter (Signed)
*  Asthma/Allergy  Pharmacy Patient Advocate Encounter   Received notification from CoverMyMeds that prior authorization for Opzelura 1.5% cream  is required/requested.   Insurance verification completed.   The patient is insured through [facility] .   Per test claim: PA required; PA submitted to above mentioned insurance via CoverMyMeds Key/confirmation #/EOC [id number] Status is pending

## 2024-01-26 DIAGNOSIS — Z01419 Encounter for gynecological examination (general) (routine) without abnormal findings: Secondary | ICD-10-CM | POA: Diagnosis not present

## 2024-01-26 DIAGNOSIS — Z3041 Encounter for surveillance of contraceptive pills: Secondary | ICD-10-CM | POA: Diagnosis not present

## 2024-02-07 ENCOUNTER — Ambulatory Visit (INDEPENDENT_AMBULATORY_CARE_PROVIDER_SITE_OTHER): Admitting: Allergy & Immunology

## 2024-02-07 ENCOUNTER — Encounter: Payer: Self-pay | Admitting: Allergy & Immunology

## 2024-02-07 ENCOUNTER — Other Ambulatory Visit (HOSPITAL_COMMUNITY): Payer: Self-pay

## 2024-02-07 ENCOUNTER — Other Ambulatory Visit: Payer: Self-pay

## 2024-02-07 VITALS — BP 138/84 | HR 92 | Temp 98.0°F | Resp 18

## 2024-02-07 DIAGNOSIS — J309 Allergic rhinitis, unspecified: Secondary | ICD-10-CM

## 2024-02-07 MED ORDER — PREDNISONE 10 MG PO TABS
ORAL_TABLET | ORAL | 0 refills | Status: DC
  Filled 2024-02-07: qty 36, 9d supply, fill #0

## 2024-02-07 NOTE — Progress Notes (Signed)
 FOLLOW UP  Date of Service/Encounter:  02/07/24   Assessment:   Intermittent asthma, uncomplicated  Perennial and seasonal allergic rhinitis (grass, ragweed, weeds, trees, mold, dust mites, cat, dog, horse, and cockroach)  Food allergy (tree nuts, shellfish, fruits)  Allergic sinusitis  Plan/Recommendations:   1. Allergic sinusitis - Start prednisone taper. - I do not think that antibiotics are indicated given the 36-hour timeframe of symptoms. - Continue with Mucinex as you are doing. - Continue with nasal sprays. - Call us  at the end of the week if symptoms have not resolved and we can send in an antibiotic at that time.  2. Follow up as scheduled.   Subjective:   Jane Ellison is a 33 y.o. female presenting today for follow up of  Chief Complaint  Patient presents with   Breathing Problem   Allergic Rhinitis     CONGESTION AND THROAT PAIN - taking claritin and ryaltris  and alzelter sinus and cold     Jane Ellison has a history of the following: Patient Active Problem List   Diagnosis Date Noted   Chronic hypertension affecting pregnancy 07/06/2023   Idiopathic intracranial hypertension 07/28/2021   Indication for care in labor and delivery, antepartum 05/26/2019   Mild intermittent asthma without complication 12/14/2018   [redacted] weeks gestation of pregnancy 12/14/2018   Seasonal and perennial allergic rhinoconjunctivitis of both eyes 12/14/2018   Adverse food reaction 12/14/2018   Pregnant and not yet delivered in second trimester 12/14/2018    History obtained from: chart review and patient.  Discussed the use of AI scribe software for clinical note transcription with the patient and/or guardian, who gave verbal consent to proceed.  Jane Ellison is a 33 y.o. female presenting for a sick visit.  She was last seen in January 2025 by Dr. Tempie Fee.  She was continued on albuterol  for her intermittent asthma.  She already had testing in the past  that was positive to grass, ragweed, weeds, trees, mold, dust mites, cat, dog, horse, and cockroach.  She was started on Ryaltris  2 sprays per nostril up to twice a day.  She continue to avoid tree nuts and shellfish.  Repeat labs were collected.  Labs are notable for an elevated IgE to Ara h 8.  Environmental allergy panel was positive to dust mites, cat, dog, grass, trees, weed, cockroach, and mice.  Tree nuts were positive to the entire panel.  Shellfish were positive to the entire panel, but on the low side.  Watermelon, pineapple, plum, and peach were all low.  Tryptase was normal.  Since last visit, she has mostly done well.   She has been experiencing sinus pressure and drainage since yesterday, with symptoms beginning after lunch. Significant drainage has led to a sore throat, constant sneezing throughout the afternoon, and a fever reaching 100F by 5 PM. Upon returning home, she noticed increased sinus pressure and facial pain.  She has been managing her symptoms with Claritin, taking two doses daily, and using Ryaltris . She has a history of allergies and previously underwent allergy shots during high school but discontinued them due to adverse reactions. Her allergies have been well-controlled until this recent episode.  She recalls a similar episode in December, which resolved on its own without treatment. No current fever or chest tightness. She mentions sleeping well, having gone to bed at 8 PM and sleeping through the morning.  In her social history, she moved to [place] in 2011 and is the second oldest among her siblings.  She has three children, with her mother assisting in their care. Her oldest child attends [employer].     Otherwise, there have been no changes to her past medical history, surgical history, family history, or social history.    Review of systems otherwise negative other than that mentioned in the HPI.    Objective:   unknown if currently  breastfeeding. There is no height or weight on file to calculate BMI.    Physical Exam Vitals reviewed.  Constitutional:      Appearance: She is well-developed.  HENT:     Head: Normocephalic and atraumatic.     Comments: Sick appearing.    Right Ear: Tympanic membrane, ear canal and external ear normal.     Left Ear: Tympanic membrane, ear canal and external ear normal.     Nose: Rhinorrhea present. No nasal deformity, septal deviation or mucosal edema.     Right Turbinates: Enlarged, swollen and pale.     Left Turbinates: Enlarged, swollen and pale.     Right Sinus: Maxillary sinus tenderness present. No frontal sinus tenderness.     Left Sinus: Maxillary sinus tenderness present. No frontal sinus tenderness.     Mouth/Throat:     Mouth: Mucous membranes are not pale and not dry.     Pharynx: Uvula midline.  Eyes:     General: Lids are normal. No allergic shiner.       Right eye: No discharge.        Left eye: No discharge.     Conjunctiva/sclera: Conjunctivae normal.     Right eye: Right conjunctiva is not injected. No chemosis.    Left eye: Left conjunctiva is not injected. No chemosis.    Pupils: Pupils are equal, round, and reactive to light.  Cardiovascular:     Rate and Rhythm: Normal rate and regular rhythm.     Heart sounds: Normal heart sounds.  Pulmonary:     Effort: Pulmonary effort is normal. No tachypnea, accessory muscle usage or respiratory distress.     Breath sounds: Normal breath sounds. No wheezing, rhonchi or rales.     Comments: Moving air well in all lung fields.  No increased work of breathing. Chest:     Chest wall: No tenderness.  Lymphadenopathy:     Cervical: No cervical adenopathy.  Skin:    Coloration: Skin is not pale.     Findings: No abrasion, erythema, petechiae or rash. Rash is not papular, urticarial or vesicular.  Neurological:     Mental Status: She is alert.  Psychiatric:        Behavior: Behavior is cooperative.       Diagnostic studies: none      Drexel Gentles, MD  [facility] 

## 2024-02-07 NOTE — Patient Instructions (Signed)
 1. Allergic sinusitis - Start prednisone taper. - I do not think that antibiotics are indicated given the 36-hour timeframe of symptoms. - Continue with Mucinex as you are doing. - Continue with nasal sprays. - Call us  at the end of the week if symptoms have not resolved and we can send in an antibiotic at that time.  2. Follow up as scheduled.

## 2024-04-12 ENCOUNTER — Telehealth: Admitting: Physician Assistant

## 2024-04-12 ENCOUNTER — Telehealth

## 2024-04-12 DIAGNOSIS — A09 Infectious gastroenteritis and colitis, unspecified: Secondary | ICD-10-CM

## 2024-04-12 MED ORDER — AZITHROMYCIN 500 MG PO TABS
500.0000 mg | ORAL_TABLET | Freq: Every day | ORAL | 0 refills | Status: DC
Start: 2024-04-12 — End: 2024-05-18

## 2024-04-12 MED ORDER — ONDANSETRON 4 MG PO TBDP
4.0000 mg | ORAL_TABLET | Freq: Three times a day (TID) | ORAL | 0 refills | Status: AC | PRN
Start: 2024-04-12 — End: ?

## 2024-04-12 NOTE — Progress Notes (Signed)
 Virtual Visit Consent   Jane Ellison, you are scheduled for a virtual visit with a [facility] provider today. Just as with appointments in the office, your consent must be obtained to participate. Your consent will be active for this visit and any virtual visit you may have with one of our providers in the next 365 days. If you have a MyChart account, a copy of this consent can be sent to you electronically.  As this is a virtual visit, video technology does not allow for your provider to perform a traditional examination. This may limit your provider's ability to fully assess your condition. If your provider identifies any concerns that need to be evaluated in person or the need to arrange testing (such as labs, EKG, etc.), we will make arrangements to do so. Although advances in technology are sophisticated, we cannot ensure that it will always work on either your end or our end. If the connection with a video visit is poor, the visit may have to be switched to a telephone visit. With either a video or telephone visit, we are not always able to ensure that we have a secure connection.  By engaging in this virtual visit, you consent to the provision of healthcare and authorize for your insurance to be billed (if applicable) for the services provided during this visit. Depending on your insurance coverage, you may receive a charge related to this service.  I need to obtain your verbal consent now. Are you willing to proceed with your visit today? Jane Ellison has provided verbal consent on 04/12/2024 for a virtual visit (video or telephone). Jane Ellison, [place]  Date: 04/12/2024 3:45 PM   Virtual Visit via Video Note   I, Jane Ellison, connected with  Jane Ellison  ([id number], 1991-07-12) on 04/12/24 at  3:30 PM EDT by a video-enabled telemedicine application and verified that I am speaking with the correct person using two  identifiers.  Location: Patient: Virtual Visit Location Patient: Home Provider: Virtual Visit Location Provider: Home Office   I discussed the limitations of evaluation and management by telemedicine and the availability of in person appointments. The patient expressed understanding and agreed to proceed.    History of Present Illness: Jane Ellison is a 33 y.o. who identifies as a female who was assigned female at birth, and is being seen today for 2 days of lower abdominal cramping associated with nausea (no emesis) and frequent diarrhea. Notes she and her family returned from a trip Tuesday, having been out of the country. Notes symptoms have continued since onset, with some noted darkening of loose stool today, without melena. An episode of BBBPR.  Denies fever but initially with chills. Denies upper abdominal pain, history of ulceration, heartburn on indigestion. Is able to hydrate but not eating anything due to worsening of symptoms with meals. Other family members with similar but much milder symptoms.       HPI: HPI  Problems:  Patient Active Problem List   Diagnosis Date Noted   Chronic hypertension affecting pregnancy 07/06/2023   Idiopathic intracranial hypertension 07/28/2021   Indication for care in labor and delivery, antepartum 05/26/2019   Mild intermittent asthma without complication 12/14/2018   [redacted] weeks gestation of pregnancy 12/14/2018   Seasonal and perennial allergic rhinoconjunctivitis of both eyes 12/14/2018   Adverse food reaction 12/14/2018   Pregnant and not yet delivered in second trimester 12/14/2018    Allergies:  Allergies  Allergen Reactions   Other  Anaphylaxis, Other (See Comments) and Itching    Tree Nuts Only   Shellfish Allergy Anaphylaxis and Nausea And Vomiting   Pollen Extract Other (See Comments)    Runny nose - Seasonal Allergies   Iodinated Contrast Media Other (See Comments)   Iodine Other (See Comments)   Medications:   Current Outpatient Medications:    azithromycin (ZITHROMAX) 500 MG tablet, Take 1 tablet (500 mg total) by mouth daily., Disp: 3 tablet, Rfl: 0   ondansetron  (ZOFRAN -ODT) 4 MG disintegrating tablet, Take 1 tablet (4 mg total) by mouth every 8 (eight) hours as needed for nausea or vomiting., Disp: 20 tablet, Rfl: 0   acetaminophen  (TYLENOL ) 325 MG tablet, Take 2 tablets (650 mg total) by mouth every 4 (four) hours as needed (for pain scale < 4)., Disp: 100 tablet, Rfl: 0   albuterol  (VENTOLIN  HFA) 108 (90 Base) MCG/ACT inhaler, Inhale 2 puffs into the lungs every 6 (six) hours as needed for wheezing or shortness of breath., Disp: 6.7 g, Rfl: 2   EPINEPHrine  (EPIPEN  2-PAK) 0.3 mg/0.3 mL IJ SOAJ injection, Inject 0.3 mg into the muscle as needed for anaphylaxis., Disp: 2 each, Rfl: 1   EPINEPHrine  (NEFFY ) 2 MG/0.1ML SOLN, PLACE ONE SPRAY INTO ONE NOSTRIL AS NEEDED FOR ANAPHYLAXIS, Disp: 6 each, Rfl: 1   ibuprofen  (ADVIL ) 600 MG tablet, Take 1 tablet (600 mg total) by mouth every 6 (six) hours., Disp: 30 tablet, Rfl: 0   ketotifen  (ZADITOR ) 0.035 % ophthalmic solution, Place 1 drop into both eyes 2 (two) times daily as needed., Disp: 10 mL, Rfl: 5   loratadine (CLARITIN) 10 MG tablet, Take 10 mg by mouth daily., Disp: , Rfl:    Multiple Vitamin (MULTIVITAMIN WITH MINERALS) TABS tablet, Take 1 tablet by mouth daily., Disp: , Rfl:    NIFEdipine  (PROCARDIA  XL/NIFEDICAL XL) 60 MG 24 hr tablet, Take 1 tablet (60 mg total) by mouth daily., Disp: 90 tablet, Rfl: 1   Olopatadine-Mometasone (RYALTRIS ) 665-25 MCG/ACT SUSP, Place 2 sprays into the nose 2 (two) times daily as needed., Disp: 29 g, Rfl: 2   predniSONE  (DELTASONE ) 10 MG tablet, Take 3 tabs (30mg ) twice daily for 3 days, then 2 tabs (20mg ) twice daily for 3 days, then 1 tab (10mg ) twice daily for 3 days, then STOP., Disp: 36 tablet, Rfl: 0   Rimegepant Sulfate  (NURTEC) 75 MG TBDP, Dissolve 1 tablet on the tongue as needed at first sign of migraine,  Disp: 15 tablet, Rfl: 2   Ruxolitinib  Phosphate (OPZELURA ) 1.5 % CREA, Apply 1 Application topically daily as needed., Disp: , Rfl:    Ruxolitinib  Phosphate (OPZELURA ) 1.5 % CREA, Apply 1 Application topically 2 (two) times daily as needed., Disp: 60 g, Rfl: 3   triamcinolone cream (KENALOG) 0.1 %, Apply 1 application. topically daily as needed (eczema)., Disp: , Rfl: 6  Observations/Objective: Patient is well-developed, well-nourished in no acute distress.  Resting comfortably  at home.  Head is normocephalic, atraumatic.  No labored breathing.  Speech is clear and coherent with logical content.  Patient is alert and oriented at baseline.   Assessment and Plan: 1. Traveler's diarrhea (Primary) - ondansetron  (ZOFRAN -ODT) 4 MG disintegrating tablet; Take 1 tablet (4 mg total) by mouth every 8 (eight) hours as needed for nausea or vomiting.  Dispense: 20 tablet; Refill: 0 - azithromycin (ZITHROMAX) 500 MG tablet; Take 1 tablet (500 mg total) by mouth daily.  Dispense: 3 tablet; Refill: 0  Discussed viral versus bacterial etiology of symptoms since family members  with similar symptoms, albeit much milder. Giving now with chills and worsening diarrhea, will add on Azithromycin in case of traveler's diarrhea. Start BRAT diet and zofran . Will have her refrain from any NSAID use. ER evaluation for any new or worsening symptoms despite treatment via virtual care, especially giving PCP office closures for the upcoming holiday.   Follow Up Instructions: I discussed the assessment and treatment plan with the patient. The patient was provided an opportunity to ask questions and all were answered. The patient agreed with the plan and demonstrated an understanding of the instructions.  A copy of instructions were sent to the patient via MyChart unless otherwise noted below.   The patient was advised to call back or seek an in-person evaluation if the symptoms worsen or if the condition fails to improve as  anticipated.    Jane Velma Lunger, PA-C

## 2024-04-12 NOTE — Patient Instructions (Signed)
 Jane Ellison, thank you for joining [name], PA-C for today's virtual visit.  While this provider is not your primary care provider (PCP), if your PCP is located in our provider database this encounter information will be shared with them immediately following your visit.   A [facility] MyChart account gives you access to today's visit and all your visits, tests, and labs performed at [facility]  click here if you don't have a [facility] MyChart account or go to [email or web address]  Consent: (Patient) Jane Ellison provided verbal consent for this virtual visit at the beginning of the encounter.  Current Medications:  Current Outpatient Medications:    acetaminophen  (TYLENOL ) 325 MG tablet, Take 2 tablets (650 mg total) by mouth every 4 (four) hours as needed (for pain scale < 4)., Disp: 100 tablet, Rfl: 0   albuterol  (VENTOLIN  HFA) 108 (90 Base) MCG/ACT inhaler, Inhale 2 puffs into the lungs every 6 (six) hours as needed for wheezing or shortness of breath., Disp: 6.7 g, Rfl: 2   EPINEPHrine  (EPIPEN  2-PAK) 0.3 mg/0.3 mL IJ SOAJ injection, Inject 0.3 mg into the muscle as needed for anaphylaxis., Disp: 2 each, Rfl: 1   EPINEPHrine  (NEFFY ) 2 MG/0.1ML SOLN, PLACE ONE SPRAY INTO ONE NOSTRIL AS NEEDED FOR ANAPHYLAXIS, Disp: 6 each, Rfl: 1   ibuprofen  (ADVIL ) 600 MG tablet, Take 1 tablet (600 mg total) by mouth every 6 (six) hours., Disp: 30 tablet, Rfl: 0   ketotifen  (ZADITOR ) 0.035 % ophthalmic solution, Place 1 drop into both eyes 2 (two) times daily as needed., Disp: 10 mL, Rfl: 5   loratadine (CLARITIN) 10 MG tablet, Take 10 mg by mouth daily., Disp: , Rfl:    Multiple Vitamin (MULTIVITAMIN WITH MINERALS) TABS tablet, Take 1 tablet by mouth daily., Disp: , Rfl:    NIFEdipine  (PROCARDIA  XL/NIFEDICAL XL) 60 MG 24 hr tablet, Take 1 tablet (60 mg total) by mouth daily., Disp: 90 tablet, Rfl: 1   Olopatadine-Mometasone (RYALTRIS ) 665-25 MCG/ACT  SUSP, Place 2 sprays into the nose 2 (two) times daily as needed., Disp: 29 g, Rfl: 2   predniSONE  (DELTASONE ) 10 MG tablet, Take 3 tabs (30mg ) twice daily for 3 days, then 2 tabs (20mg ) twice daily for 3 days, then 1 tab (10mg ) twice daily for 3 days, then STOP., Disp: 36 tablet, Rfl: 0   Rimegepant Sulfate  (NURTEC) 75 MG TBDP, Dissolve 1 tablet on the tongue as needed at first sign of migraine, Disp: 15 tablet, Rfl: 2   Ruxolitinib  Phosphate (OPZELURA ) 1.5 % CREA, Apply 1 Application topically daily as needed., Disp: , Rfl:    Ruxolitinib  Phosphate (OPZELURA ) 1.5 % CREA, Apply 1 Application topically 2 (two) times daily as needed., Disp: 60 g, Rfl: 3   triamcinolone cream (KENALOG) 0.1 %, Apply 1 application. topically daily as needed (eczema)., Disp: , Rfl: 6   Medications ordered in this encounter:  No orders of the defined types were placed in this encounter.    *If you need refills on other medications prior to your next appointment, please contact your pharmacy*  Follow-Up: Call back or seek an in-person evaluation if the symptoms worsen or if the condition fails to improve as anticipated.   Virtual Care [phone]  Other Instructions Please hydrate and rest. Once you feel up to eating, follow the dietary recommendations below. Ok to use OTC tylenol  if needed. Avoid NSAIDs (Ibuprofen , Aleve, Goody Powders) and Aspirin.  The Zofran  should help calm nausea. Take the Azithromycin as  directed. If you note any non-resolving, new, or worsening symptoms despite treatment, please seek an in-person evaluation ASAP.   Food Choices to Help Relieve Diarrhea, Adult Diarrhea can make you feel weak and cause you to become dehydrated. Dehydration is a condition in which there is not enough water or other fluids in the body. It is important to choose the right foods and drinks to: Relieve diarrhea. Replace lost fluids and nutrients. Prevent dehydration. What are tips for  following this plan? Relieving diarrhea Avoid foods that make your diarrhea worse. These may include: Foods and drinks that are sweetened with high-fructose corn syrup, honey, or sweeteners such as xylitol, sorbitol, and mannitol. Check food labels for these ingredients. Fried, greasy, or spicy foods. Raw fruits and vegetables. Eat foods that are rich in probiotics. These include foods such as yogurt and fermented milk products. Probiotics can help increase healthy bacteria in your stomach and intestines (gastrointestinal or GI tract). This may help digestion and stop diarrhea. If you have lactose intolerance, avoid dairy products. These may make your diarrhea worse. Take medicine to help stop diarrhea only as told by your health care provider. Replacing nutrients  Eat bland, easy-to-digest foods in small amounts as you are able, until your diarrhea starts to get better. These foods include bananas, applesauce, rice, toast, and crackers. Over time, add nutrient-rich foods as your body tolerates them or as told by your health care provider. These include: Well-cooked protein foods, such as eggs, lean meats like fish or chicken without skin, and tofu. Peeled, seeded, and soft-cooked fruits and vegetables. Low-fat dairy products. Whole grains. Take vitamin and mineral supplements as told by your health care provider. Preventing dehydration  Start by sipping water or a solution to prevent dehydration (oral rehydration solution, or ORS). This is a drink that helps replace fluids and minerals your body has lost. You can buy an ORS at pharmacies and retail stores. Try to drink at least 8-10 cups (2,000-2,500 mL) of fluid each day to help replace lost fluids. If your urine is pale yellow, you are getting enough fluids. You may drink other liquids in addition to water, such as fruit juice that you have added water to (diluted fruit juice) or low-calorie sports drinks, as tolerated or as told by your  health care provider. Avoid drinks with caffeine, such as coffee, tea, or soft drinks. Avoid alcohol. This information is not intended to replace advice given to you by your health care provider. Make sure you discuss any questions you have with your health care provider. Document Revised: 03/16/2022 Document Reviewed: 03/16/2022 Elsevier Patient Education  2024 Elsevier Inc.  If you have been instructed to have an in-person evaluation today at a local Urgent Care facility, please use the link below. It will take you to a list of all of our available [facility], including address, phone number and hours of operation. Please do not delay care.  [facility]  If you or a family member do not have a primary care provider, use the link below to schedule a visit and establish care. When you choose a [facility] primary care physician or advanced practice provider, you gain a long-term partner in health. Find a Primary Care Provider  Learn more about [facility]'s in-office and virtual care options: [facility] - Get Care Now

## 2024-05-18 ENCOUNTER — Encounter: Payer: Self-pay | Admitting: Allergy

## 2024-05-18 ENCOUNTER — Other Ambulatory Visit: Payer: Self-pay

## 2024-05-18 ENCOUNTER — Other Ambulatory Visit (HOSPITAL_COMMUNITY): Payer: Self-pay

## 2024-05-18 ENCOUNTER — Ambulatory Visit: Payer: 59 | Admitting: Allergy

## 2024-05-18 VITALS — BP 120/94 | HR 88 | Temp 98.7°F | Resp 16 | Ht 62.5 in | Wt 215.5 lb

## 2024-05-18 DIAGNOSIS — L2089 Other atopic dermatitis: Secondary | ICD-10-CM | POA: Diagnosis not present

## 2024-05-18 DIAGNOSIS — T7800XD Anaphylactic reaction due to unspecified food, subsequent encounter: Secondary | ICD-10-CM | POA: Diagnosis not present

## 2024-05-18 DIAGNOSIS — J4520 Mild intermittent asthma, uncomplicated: Secondary | ICD-10-CM

## 2024-05-18 DIAGNOSIS — H1013 Acute atopic conjunctivitis, bilateral: Secondary | ICD-10-CM | POA: Diagnosis not present

## 2024-05-18 DIAGNOSIS — J302 Other seasonal allergic rhinitis: Secondary | ICD-10-CM | POA: Diagnosis not present

## 2024-05-18 DIAGNOSIS — J3089 Other allergic rhinitis: Secondary | ICD-10-CM | POA: Diagnosis not present

## 2024-05-18 NOTE — Patient Instructions (Addendum)
 Asthma: under good control at this time without maintenance medications Daily controller medication(s): NONE Prior to physical activity: May use albuterol  rescue inhaler 2 puffs 5 to 15 minutes prior to strenuous physical activities. Rescue medications: May use albuterol  rescue inhaler 2 puffs or nebulizer every 4 to 6 hours as needed for shortness of breath, chest tightness, coughing, and wheezing. Monitor frequency of use.  Let me know if you are not meeting the below goals Lung function is normal today! Asthma control goals:  Full participation in all desired activities (may need albuterol  before activity) Albuterol  use two times or less a week on average (not counting use with activity) Cough interfering with sleep two times or less a month Oral steroids no more than once a year No hospitalizations  Allergic rhinitis: Continue avoidance measures for dust mites, cat dander, dog dander, grass pollen, tree pollen, weed pollen, rodent, cockroach, mold.  Recommend use of Ryaltris  nasal spray 2 sprays each nostril twice a day at this time for congestion/drainage control Left nostril completely swollen leading to nasal obstruction.  Would use Afrin 2 sprays in nostril wait several minutes until can breathe more freely then use Ryaltris  as above. Do not use Afrin more than 3-5 days in a row.   With using nasal sprays point tip of bottle toward eye on same side nostril and lean head slightly forward for best technique.   Recommend use of Allegra 180 mg daily.   Use Zaditor  1 drop each eye twice daily as needed for itchy/watery eyes Consider completion of allergen immunotherapy for better symptom control.  Can start either traditional way or RUSH protocol way.    Food allergy: Continue avoidance of tree nuts and shellfish Serum IgE levels showed relatively low shellfish IgE levels,  moderate/high tree nut IgE levels and low IgE levels to Watermelon, pineapple, plum, peach (most likely OAS to  fruits) Keep peanuts in the diet  You are eligible for in-office shellfish challenge if interested in seeing if you are no longer allergic.  You can schedule when/if you become ready to do so.   Have access to epinephrine  (Epipen  0.3mg  or Neffy  2mg  nasal spray) at all times.   Follow emergency action plan in case of allergic reaction  The oral allergy syndrome (OAS) or pollen-food allergy syndrome (PFAS) is a relatively common form of food allergy, particularly in adults. It typically occurs in people who have pollen allergies when the immune system sees proteins on the food that look like proteins on the pollen. This results in the allergy antibody (IgE) binding to the food instead of the pollen. Patients typically report itching and/or mild swelling of the mouth and throat immediately following ingestion of certain uncooked fruits (including nuts) or raw vegetables. Only a very small number of affected individuals experience systemic allergic reactions, such as anaphylaxis which occurs with true food allergies.      Eczema Continue daily moisturization especially after bathing Continue as needed use of the topical steroid creams (triamcinolone for mild-mod flares; clobetasol for severe flares) and/or non-steroids (ie Opzelura ) when needed  Follow up in 6-12 month or sooner if needed

## 2024-05-18 NOTE — Progress Notes (Signed)
 Follow-up Note  RE: Jane Ellison MRN: [id number] DOB: [birth year] Date of Office Visit: 05/18/2024   History of present illness: Jane Ellison is a 33 y.o. female presenting today for follow-up of asthma, allergic rhinitis with conjunctivitis, OAS and food allergy.  She was last seen in the office on 02/07/24 for sinusitis by Dr Iva.  Discussed the use of AI scribe software for clinical note transcription with the patient, who gave verbal consent to proceed.  She has been experiencing a flare-up of her eczema over the past two weeks, coinciding with changes in the weather. Small, rough spots have appeared on her neck and thigh areas. She is using Opzelura  which does help but not currently resolving flare. She also has triamcinolone and clobetasol available but has not used these at this time. She moisturizes after bathing.  Her allergic rhinitis symptoms have worsened recently with the changes in the weather, causing sneezing and itchy eyes, but no cough. She has been using Claritin and has had to double the dose. Zaditor  eye drops help when used but does wear contacts and thus applies in morning and evenings when not wearing contacts. She has Ryaltris  nasal spray, which was effective in the spring but has not been used recently.  Does currently feel congested.   She has a history of shellfish and tree nut allergies. Recent testing showed her shellfish allergy levels were relatively low, making a food challenge a potential option, though she remains cautious. Her tree nut allergy levels remain high, and she continues to avoid them. She has an EpiPen  and Neffy  available in case of reaction.  In April, she experienced a sinus infection that resolved with steroids without the need for antibiotics. She has not needed her rescue inhaler recently, and her breathing tests are normal.      Review of systems: 10pt ROS negative unless noted above in HPI   Past  medical/social/surgical/family history have been reviewed and are unchanged unless specifically indicated below.  No changes  Medication List: Current Outpatient Medications  Medication Sig Dispense Refill   acetaminophen  (TYLENOL ) 325 MG tablet Take 2 tablets (650 mg total) by mouth every 4 (four) hours as needed (for pain scale < 4). 100 tablet 0   albuterol  (VENTOLIN  HFA) 108 (90 Base) MCG/ACT inhaler Inhale 2 puffs into the lungs every 6 (six) hours as needed for wheezing or shortness of breath. 6.7 g 2   EPINEPHrine  (EPIPEN  2-PAK) 0.3 mg/0.3 mL IJ SOAJ injection Inject 0.3 mg into the muscle as needed for anaphylaxis. 2 each 1   EPINEPHrine  (NEFFY ) 2 MG/0.1ML SOLN PLACE ONE SPRAY INTO ONE NOSTRIL AS NEEDED FOR ANAPHYLAXIS 6 each 1   ibuprofen  (ADVIL ) 600 MG tablet Take 1 tablet (600 mg total) by mouth every 6 (six) hours. 30 tablet 0   ketotifen  (ZADITOR ) 0.035 % ophthalmic solution Place 1 drop into both eyes 2 (two) times daily as needed. 10 mL 5   loratadine (CLARITIN) 10 MG tablet Take 10 mg by mouth daily.     Multiple Vitamin (MULTIVITAMIN WITH MINERALS) TABS tablet Take 1 tablet by mouth daily.     NIFEdipine  (PROCARDIA  XL/NIFEDICAL XL) 60 MG 24 hr tablet Take 1 tablet (60 mg total) by mouth daily. 90 tablet 1   Olopatadine-Mometasone (RYALTRIS ) 665-25 MCG/ACT SUSP Place 2 sprays into the nose 2 (two) times daily as needed. 29 g 2   ondansetron  (ZOFRAN -ODT) 4 MG disintegrating tablet Take 1 tablet (4 mg total) by mouth every 8 (  eight) hours as needed for nausea or vomiting. 20 tablet 0   Ruxolitinib  Phosphate (OPZELURA ) 1.5 % CREA Apply 1 Application topically 2 (two) times daily as needed. 60 g 3   triamcinolone cream (KENALOG) 0.1 % Apply 1 application. topically daily as needed (eczema).  6   azithromycin  (ZITHROMAX ) 500 MG tablet Take 1 tablet (500 mg total) by mouth daily. 3 tablet 0   predniSONE  (DELTASONE ) 10 MG tablet Take 3 tabs (30mg ) twice daily for 3 days, then 2 tabs  (20mg ) twice daily for 3 days, then 1 tab (10mg ) twice daily for 3 days, then STOP. 36 tablet 0   Rimegepant Sulfate  (NURTEC) 75 MG TBDP Dissolve 1 tablet on the tongue as needed at first sign of migraine 15 tablet 2   Ruxolitinib  Phosphate (OPZELURA ) 1.5 % CREA Apply 1 Application topically daily as needed.     No current facility-administered medications for this visit.     Known medication allergies: Allergies  Allergen Reactions   Other Anaphylaxis, Other (See Comments) and Itching    Tree Nuts Only   Shellfish Allergy Anaphylaxis and Nausea And Vomiting   Pollen Extract Other (See Comments)    Runny nose - Seasonal Allergies   Iodinated Contrast Media Other (See Comments)   Iodine Other (See Comments)     Physical examination: Blood pressure (!) 120/94, pulse 88, temperature 98.7 F (37.1 C), temperature source Temporal, resp. rate 16, height 5' 2.5 (1.588 m), weight 215 lb 8 oz (97.8 kg), SpO2 97%, unknown if currently breastfeeding.  General: Alert, interactive, in no acute distress. HEENT: PERRLA, rt TMs pearly gray, lt TM obscured by cerumen, lt turbinates enlarged obstructing flow with mucus; rt turbinate non-edematous without discharge, post-pharynx non erythematous. Neck: Supple without lymphadenopathy. Lungs: Clear to auscultation without wheezing, rhonchi or rales. {no increased work of breathing. CV: Normal S1, S2 without murmurs. Abdomen: Nondistended, nontender. Skin: Warm and dry, without lesions or rashes. Extremities:  No clubbing, cyanosis or edema. Neuro:   Grossly intact.  Diagnostics/Labs: Labs:  Component     Latest Ref Rng 11/09/2023  IgE (Immunoglobulin E), Serum     6 - 495 IU/mL 925 (H)   D Pteronyssinus IgE     Class V kU/L 99.70 !   D Farinae IgE     Class VI kU/L >100 !   Cat Dander IgE     Class IV kU/L 13.80 !   Dog Dander IgE     Class IV kU/L 7.57 !   [place] Grass IgE     Class III kU/L 1.90 !   Timothy Grass IgE     Class IV  kU/L 9.36 !   [place] Grass IgE     Class IV kU/L 5.83 !   Cockroach, German IgE     Class II kU/L 1.13 !   Penicillium Chrysogen IgE     Class 0/I kU/L 0.12 !   Cladosporium Herbarum IgE     Class 0/I kU/L 0.18 !   Aspergillus Fumigatus IgE     Class 0/I kU/L 0.23 !   Alternaria Alternata IgE     Class 0/I kU/L 0.12 !   Maple/Box Elder IgE     Class I kU/L 0.32 !   Common Silver [place] IgE     Class IV kU/L 8.25 !   [place], [place] IgE     Class III kU/L 3.83 !   [place], [place] IgE     Class IV kU/L 10.30 !  Elm, American IgE     Class II kU/L 0.66 !   [place] IgE     Class I kU/L 0.33 !   Pecan, [place] IgE     Class IV kU/L 7.19 !   White Mulberry IgE     Class 0/I kU/L 0.12 !   Ragweed, Short IgE     Class V kU/L 23.50 !   Pigweed, Rough IgE     Class II kU/L 0.78 !   Sheep Sorrel IgE Qn     Class 0/I kU/L 0.20 !   Mouse Urine IgE     Class IV kU/L 4.78 !   Class Description Allergens Comment   F017-IgE Hazelnut ([place])     Class IV kU/L 6.02 !   F256-IgE Walnut     Class IV kU/L 5.94 !   F202-IgE Cashew Nut     Class III kU/L 2.17 !   F018-IgE [place] Nut     Class 0/I kU/L 0.12 !   Peanut , IgE     Class 0/I kU/L 0.23 !   Macadamia Nut, IgE     Class I kU/L 0.44 !   Pecan Nut IgE     Class II kU/L 1.21 !   F203-IgE Pistachio Nut     Class III kU/L 2.34 !   F020-IgE Almond     Class 0/I kU/L 0.17 !   F023-IgE Crab     Class III kU/L 1.62 !   Shrimp IgE     Class II kU/L 1.33 !   F080-IgE Lobster     Class III kU/L 1.45 !   Clam IgE     Class I kU/L 0.53 !   F290-IgE Oyster     Class 0/I kU/L 0.26 !   Scallop IgE     Class II kU/L 0.71 !   F422-IgE Ara h 1     Class 0 kU/L <0.10   F423-IgE Ara h 2     Class 0/I kU/L 0.11 !   F424-IgE Ara h 3     Class 0 kU/L <0.10   F447-IgE Ara h 6     Class 0 kU/L <0.10   F352-IgE Ara h 8     Class II kU/L 1.04 !   F427-IgE Ara h 9     Class 0 kU/L <0.10   Cor A 1 IgE     Class IV kU/L 5.12 !    Cor A 8 IgE     Class 0 kU/L <0.10   Cor A 9 IgE     Class II kU/L 0.63 !   Cor A 14 IgE     Class 0/I kU/L 0.18 !   Jug R 1 IgE     Class III kU/L 1.80 !   Jug R 3 IgE     Class 0 kU/L <0.10   Allergen Watermelon IgE     Class 0/I kU/L 0.17 !   Pineapple IgE     Class 0/I kU/L 0.15 !   F255-IgE Plum     Class 0/I kU/L 0.13 !   Allergen, Peach f95     Class II kU/L 0.85 !   Tryptase     2.2 - 13.2 ug/L 3.8   Allergen Comments Note   ANA O 3 IgE     Class III kU/L 2.82 !   Ber E 1 IgE     Class 0 kU/L <0.10      Spirometry: FEV1: 2.38L  92%, FVC: 1.97L 97%, ratio consistent with nonobstructive pattern  Assessment and plan: Asthma: under good control at this time without maintenance medications Daily controller medication(s): NONE Prior to physical activity: May use albuterol  rescue inhaler 2 puffs 5 to 15 minutes prior to strenuous physical activities. Rescue medications: May use albuterol  rescue inhaler 2 puffs or nebulizer every 4 to 6 hours as needed for shortness of breath, chest tightness, coughing, and wheezing. Monitor frequency of use.  Let me know if you are not meeting the below goals Lung function is normal today! Asthma control goals:  Full participation in all desired activities (may need albuterol  before activity) Albuterol  use two times or less a week on average (not counting use with activity) Cough interfering with sleep two times or less a month Oral steroids no more than once a year No hospitalizations  Allergic rhinitis: Continue avoidance measures for dust mites, cat dander, dog dander, grass pollen, tree pollen, weed pollen, rodent, cockroach, mold.  Recommend use of Ryaltris  nasal spray 2 sprays each nostril twice a day at this time for congestion/drainage control Left nostril completely swollen leading to nasal obstruction.  Would use Afrin 2 sprays in nostril wait several minutes until can breathe more freely then use Ryaltris  as above. Do not  use Afrin more than 3-5 days in a row.   With using nasal sprays point tip of bottle toward eye on same side nostril and lean head slightly forward for best technique.   Recommend use of Allegra 180 mg daily.   Use Zaditor  1 drop each eye twice daily as needed for itchy/watery eyes Consider completion of allergen immunotherapy for better symptom control.  Can start either traditional way or [facility] protocol way.    Food allergy/OAS: Continue avoidance of tree nuts and shellfish Serum IgE levels showed relatively low shellfish IgE levels,  moderate/high tree nut IgE levels and low IgE levels to Watermelon, pineapple, plum, peach (most likely OAS to fruits) Keep peanuts in the diet  You are eligible for in-office shellfish challenge if interested in seeing if you are no longer allergic.  You can schedule when/if you become ready to do so.    Have access to epinephrine  (injectable epinephrine  0.3mg  or Neffy  2mg  nasal spray) at all times.     Follow emergency action plan in case of allergic reaction  Eczema Continue daily moisturization especially after bathing Continue as needed use of the topical steroid creams (trimacinolone for mild-mod flares; clobestaol for severe flares) and/or non-steroids (ie Opzelura ) when needed  Follow up in 6-12 month or sooner if needed  I appreciate the opportunity to take part in [place] care. Please do not hesitate to contact me with questions.  Sincerely,   Danita Brain, MD Allergy/Immunology [facility]

## 2024-05-19 ENCOUNTER — Other Ambulatory Visit (HOSPITAL_COMMUNITY): Payer: Self-pay

## 2024-05-21 ENCOUNTER — Other Ambulatory Visit: Payer: Self-pay

## 2024-05-21 ENCOUNTER — Other Ambulatory Visit (HOSPITAL_COMMUNITY): Payer: Self-pay

## 2024-05-22 ENCOUNTER — Other Ambulatory Visit (HOSPITAL_COMMUNITY): Payer: Self-pay

## 2024-06-21 ENCOUNTER — Other Ambulatory Visit (HOSPITAL_COMMUNITY): Payer: Self-pay

## 2024-06-22 ENCOUNTER — Other Ambulatory Visit (HOSPITAL_COMMUNITY): Payer: Self-pay

## 2024-06-22 MED ORDER — BETAMETHASONE DIPROPIONATE 0.05 % EX OINT
TOPICAL_OINTMENT | CUTANEOUS | 3 refills | Status: AC
  Filled 2024-06-22: qty 90, 30d supply, fill #0

## 2024-06-24 ENCOUNTER — Telehealth

## 2024-06-24 ENCOUNTER — Telehealth: Admitting: Family Medicine

## 2024-06-24 DIAGNOSIS — U071 COVID-19: Secondary | ICD-10-CM | POA: Diagnosis not present

## 2024-06-24 MED ORDER — PREDNISONE 20 MG PO TABS: 20.0000 mg | ORAL_TABLET | Freq: Two times a day (BID) | ORAL | 0 refills | Status: AC

## 2024-06-24 MED ORDER — BENZONATATE 100 MG PO CAPS: 100.0000 mg | ORAL_CAPSULE | Freq: Two times a day (BID) | ORAL | 0 refills | Status: AC | PRN

## 2024-06-24 MED ORDER — AZITHROMYCIN 250 MG PO TABS: ORAL_TABLET | ORAL | 0 refills | Status: AC

## 2024-06-24 NOTE — Patient Instructions (Signed)
 COVID-19: What to Know COVID-19 is an infection caused by a virus called SARS-CoV-2. This type of virus is called a coronavirus. People with COVID-19 may: Have few to no symptoms. Have mild to moderate symptoms that affect their lungs and breathing. Get very sick. What are the causes?  COVID-19 is caused by a virus. This virus may be in the air as droplets or on surfaces. It can spread from an infected person when they cough, sneeze, speak, sing, or breathe. You may become infected if: You breathe in the infected droplets in the air. You touch an object that has the virus on it. What increases the risk? You are at risk of getting COVID-19 if you have been around someone with the infection. You may be more likely to get very sick if: You are 57 years old or older. You have certain medical conditions, such as: Heart disease. Diabetes. Long-term respiratory disease. Cancer. Pregnancy. You are immunocompromised. This means your body can't fight infections easily. You have a disability that makes it hard for you to move around, you have trouble moving, or you can't move at all. What are the signs or symptoms? People may have different symptoms from COVID-19. The symptoms can also be mild to very bad. They often show up in 5-6 days after being infected. But, they can take up to 14 days to appear. Common symptoms are: Cough. Feeling tired. New loss of taste or smell. Fever. Less common symptoms are: Sore throat. Headache. Body or muscle aches. Diarrhea. A skin rash or fingers or toes that are a different color than usual. Red or irritated eyes. Sometimes, COVID-19 does not cause symptoms. How is this diagnosed? COVID-19 can be diagnosed with tests done in the lab or at home. Fluid from your nose, mouth, or lungs will be used to check for the virus. How is this treated? Treatment for COVID-19 depends on how sick you are. Mild symptoms can be treated at home with rest, fluids, and  over-the-counter medicines. very bad symptoms may be treated in a hospital intensive care unit (ICU). If you have symptoms and are at risk of getting very sick, you may be given a medicine that fights viruses. This medicine is called an antiviral. How is this prevented? To protect yourself from COVID-19: Know your risk factors. Get vaccinated. If your body can't fight infections easily, talk to your provider about treatment to help prevent COVID-19. Stay at least about 3 feet (1 meter) away from other people. Wear mask that fits well when: You can't stay at a distance from people. You're in a place with not a lot of air flow. Try to be in open spaces with good air flow when you are in public. Wash your hands often or use an alcohol-based hand sanitizer. Cover your nose and mouth when you cough or sneeze. If you think you have COVID-19 or have been around someone who has it, stay home and away from other people as told by your provider or health officials. Where to find more information To learn more: Go to [employer] Click Health Topics. Type COVID-19 in the search box. Go to VisitDestination.com.br Click Health Topics. Then click All Topics. Type COVID-19 in the search box. Get help right away if: You have trouble breathing or get short of breath. You have pain or pressure in your chest. You're feeling confused. These symptoms may be an emergency. Get help right away. Call 911. Do not wait to see if the symptoms will go away.  Do not drive yourself to the hospital. This information is not intended to replace advice given to you by your health care provider. Make sure you discuss any questions you have with your health care provider. Document Revised: 06/30/2023 Document Reviewed: 06/22/2023 Elsevier Patient Education  2025 [employer].

## 2024-06-24 NOTE — Progress Notes (Signed)
 Virtual Visit Consent   Jane Ellison, you are scheduled for a virtual visit with a [facility] provider today. Just as with appointments in the office, your consent must be obtained to participate. Your consent will be active for this visit and any virtual visit you may have with one of our providers in the next 365 days. If you have a MyChart account, a copy of this consent can be sent to you electronically.  As this is a virtual visit, video technology does not allow for your provider to perform a traditional examination. This may limit your provider's ability to fully assess your condition. If your provider identifies any concerns that need to be evaluated in person or the need to arrange testing (such as labs, EKG, etc.), we will make arrangements to do so. Although advances in technology are sophisticated, we cannot ensure that it will always work on either your end or our end. If the connection with a video visit is poor, the visit may have to be switched to a telephone visit. With either a video or telephone visit, we are not always able to ensure that we have a secure connection.  By engaging in this virtual visit, you consent to the provision of healthcare and authorize for your insurance to be billed (if applicable) for the services provided during this visit. Depending on your insurance coverage, you may receive a charge related to this service.  I need to obtain your verbal consent now. Are you willing to proceed with your visit today? Jane Soren Pigman has provided verbal consent on 06/24/2024 for a virtual visit (video or telephone). Loa Lamp, FNP  Date: 06/24/2024 10:35 AM   Virtual Visit via Video Note   I, Loa Lamp, connected with  Jane Ellison  ([id number], 1990-11-12) on 06/24/24 at 10:30 AM EDT by a video-enabled telemedicine application and verified that I am speaking with the correct person using two identifiers.  Location: Patient: Virtual  Visit Location Patient: Home Provider: Virtual Visit Location Provider: Home Office   I discussed the limitations of evaluation and management by telemedicine and the availability of in person appointments. The patient expressed understanding and agreed to proceed.    History of Present Illness: Jane Ellison is a 33 y.o. who identifies as a female who was assigned female at birth, and is being seen today for sore throat, cough, nasal congestion, history of asthma, no wheezing, no fever. She has used inhaler.  SABRA  HPI: HPI  Problems:  Patient Active Problem List   Diagnosis Date Noted   Chronic hypertension affecting pregnancy 07/06/2023   Idiopathic intracranial hypertension 07/28/2021   Indication for care in labor and delivery, antepartum 05/26/2019   Mild intermittent asthma without complication 12/14/2018   [redacted] weeks gestation of pregnancy 12/14/2018   Seasonal and perennial allergic rhinoconjunctivitis of both eyes 12/14/2018   Adverse food reaction 12/14/2018   Pregnant and not yet delivered in second trimester 12/14/2018    Allergies:  Allergies  Allergen Reactions   Other Anaphylaxis, Other (See Comments) and Itching    Tree Nuts Only   Shellfish Allergy Anaphylaxis and Nausea And Vomiting   Pollen Extract Other (See Comments)    Runny nose - Seasonal Allergies   Iodinated Contrast Media Other (See Comments)   Iodine Other (See Comments)   Medications:  Current Outpatient Medications:    acetaminophen  (TYLENOL ) 325 MG tablet, Take 2 tablets (650 mg total) by mouth every 4 (four) hours as needed (for pain  scale < 4)., Disp: 100 tablet, Rfl: 0   albuterol  (VENTOLIN  HFA) 108 (90 Base) MCG/ACT inhaler, Inhale 2 puffs into the lungs every 6 (six) hours as needed for wheezing or shortness of breath., Disp: 6.7 g, Rfl: 2   azithromycin  (ZITHROMAX ) 250 MG tablet, Take 2 tablets on day 1, then 1 tablet daily on days 2 through 5, Disp: 6 tablet, Rfl: 0   benzonatate   (TESSALON ) 100 MG capsule, Take 1 capsule (100 mg total) by mouth 2 (two) times daily as needed for cough., Disp: 20 capsule, Rfl: 0   betamethasone  dipropionate (DIPROLENE ) 0.05 % ointment, Apply to scalp 3 times per week as needed and to body twice daily x 2 wk, then daily X 2 wk, then every other day x 2 wk. Not to face. Repeat as needed., Disp: 90 g, Rfl: 3   EPINEPHrine  (EPIPEN  2-PAK) 0.3 mg/0.3 mL IJ SOAJ injection, Inject 0.3 mg into the muscle as needed for anaphylaxis., Disp: 2 each, Rfl: 1   EPINEPHrine  (NEFFY ) 2 MG/0.1ML SOLN, PLACE ONE SPRAY INTO ONE NOSTRIL AS NEEDED FOR ANAPHYLAXIS, Disp: 6 each, Rfl: 1   ibuprofen  (ADVIL ) 600 MG tablet, Take 1 tablet (600 mg total) by mouth every 6 (six) hours., Disp: 30 tablet, Rfl: 0   ketotifen  (ZADITOR ) 0.035 % ophthalmic solution, Place 1 drop into both eyes 2 (two) times daily as needed., Disp: 10 mL, Rfl: 5   loratadine (CLARITIN) 10 MG tablet, Take 10 mg by mouth daily., Disp: , Rfl:    Multiple Vitamin (MULTIVITAMIN WITH MINERALS) TABS tablet, Take 1 tablet by mouth daily., Disp: , Rfl:    NIFEdipine  (PROCARDIA  XL/NIFEDICAL XL) 60 MG 24 hr tablet, Take 1 tablet (60 mg total) by mouth daily., Disp: 90 tablet, Rfl: 1   Olopatadine-Mometasone (RYALTRIS ) 665-25 MCG/ACT SUSP, Place 2 sprays into the nose 2 (two) times daily as needed., Disp: 29 g, Rfl: 2   ondansetron  (ZOFRAN -ODT) 4 MG disintegrating tablet, Take 1 tablet (4 mg total) by mouth every 8 (eight) hours as needed for nausea or vomiting., Disp: 20 tablet, Rfl: 0   predniSONE  (DELTASONE ) 20 MG tablet, Take 1 tablet (20 mg total) by mouth 2 (two) times daily with a meal for 5 days., Disp: 10 tablet, Rfl: 0   Rimegepant Sulfate  (NURTEC) 75 MG TBDP, Dissolve 1 tablet on the tongue as needed at first sign of migraine, Disp: 15 tablet, Rfl: 2   Ruxolitinib  Phosphate (OPZELURA ) 1.5 % CREA, Apply 1 Application topically 2 (two) times daily as needed., Disp: 60 g, Rfl: 3   triamcinolone cream  (KENALOG) 0.1 %, Apply 1 application. topically daily as needed (eczema)., Disp: , Rfl: 6  Observations/Objective: Patient is well-developed, well-nourished in no acute distress.  Resting comfortably  at home.  Head is normocephalic, atraumatic.  No labored breathing.  Speech is clear and coherent with logical content.  Patient is alert and oriented at baseline.    Assessment and Plan: 1. COVID (Primary)  Increase fluids humidifier, quarantine discussed, MVI with Vit d and zinc, UC as needed.   Follow Up Instructions: I discussed the assessment and treatment plan with the patient. The patient was provided an opportunity to ask questions and all were answered. The patient agreed with the plan and demonstrated an understanding of the instructions.  A copy of instructions were sent to the patient via MyChart unless otherwise noted below.     The patient was advised to call back or seek an in-person evaluation if the symptoms worsen  or if the condition fails to improve as anticipated.    Sora Olivo, FNP

## 2024-06-25 ENCOUNTER — Other Ambulatory Visit (HOSPITAL_COMMUNITY): Payer: Self-pay

## 2024-06-25 MED ORDER — BETAMETHASONE DIPROPIONATE 0.05 % EX OINT
TOPICAL_OINTMENT | CUTANEOUS | 3 refills | Status: AC
  Filled 2024-06-25: qty 90, 30d supply, fill #0

## 2024-06-26 ENCOUNTER — Other Ambulatory Visit (HOSPITAL_COMMUNITY): Payer: Self-pay

## 2024-06-26 ENCOUNTER — Encounter (HOSPITAL_COMMUNITY): Payer: Self-pay

## 2024-07-31 ENCOUNTER — Other Ambulatory Visit: Payer: Self-pay

## 2024-07-31 ENCOUNTER — Telehealth: Admitting: Emergency Medicine

## 2024-07-31 DIAGNOSIS — R519 Headache, unspecified: Secondary | ICD-10-CM

## 2024-07-31 DIAGNOSIS — G8929 Other chronic pain: Secondary | ICD-10-CM

## 2024-07-31 DIAGNOSIS — T7800XD Anaphylactic reaction due to unspecified food, subsequent encounter: Secondary | ICD-10-CM

## 2024-07-31 MED ORDER — NEFFY 2 MG/0.1ML NA SOLN: 2.0000 | NASAL | 1 refills | Status: AC | PRN

## 2024-07-31 NOTE — Patient Instructions (Signed)
 Jane Ellison, thank you for joining Jon CHRISTELLA Belt, NP for today's virtual visit.  While this provider is not your primary care provider (PCP), if your PCP is located in our provider database this encounter information will be shared with them immediately following your visit.   A [facility] MyChart account gives you access to today's visit and all your visits, tests, and labs performed at [facility]  click here if you don't have a [facility] MyChart account or go to [email or web address]  Consent: (Patient) Jane Ellison provided verbal consent for this virtual visit at the beginning of the encounter.  Current Medications:  Current Outpatient Medications:    acetaminophen  (TYLENOL ) 325 MG tablet, Take 2 tablets (650 mg total) by mouth every 4 (four) hours as needed (for pain scale < 4)., Disp: 100 tablet, Rfl: 0   albuterol  (VENTOLIN  HFA) 108 (90 Base) MCG/ACT inhaler, Inhale 2 puffs into the lungs every 6 (six) hours as needed for wheezing or shortness of breath., Disp: 6.7 g, Rfl: 2   benzonatate  (TESSALON ) 100 MG capsule, Take 1 capsule (100 mg total) by mouth 2 (two) times daily as needed for cough., Disp: 20 capsule, Rfl: 0   betamethasone  dipropionate (DIPROLENE ) 0.05 % ointment, Apply to scalp 3 times per week as needed and to body twice daily x 2 wk, then daily X 2 wk, then every other day x 2 wk. Not to face. Repeat as needed., Disp: 90 g, Rfl: 3   betamethasone  dipropionate (DIPROLENE ) 0.05 % ointment, Apply topically as directed, Disp: 90 g, Rfl: 3   EPINEPHrine  (EPIPEN  2-PAK) 0.3 mg/0.3 mL IJ SOAJ injection, Inject 0.3 mg into the muscle as needed for anaphylaxis., Disp: 2 each, Rfl: 1   EPINEPHrine  (NEFFY ) 2 MG/0.1ML SOLN, PLACE ONE SPRAY INTO ONE NOSTRIL AS NEEDED FOR ANAPHYLAXIS, Disp: 6 each, Rfl: 1   ibuprofen  (ADVIL ) 600 MG tablet, Take 1 tablet (600 mg total) by mouth every 6 (six) hours., Disp: 30 tablet, Rfl: 0   ketotifen  (ZADITOR )  0.035 % ophthalmic solution, Place 1 drop into both eyes 2 (two) times daily as needed., Disp: 10 mL, Rfl: 5   loratadine (CLARITIN) 10 MG tablet, Take 10 mg by mouth daily., Disp: , Rfl:    Multiple Vitamin (MULTIVITAMIN WITH MINERALS) TABS tablet, Take 1 tablet by mouth daily., Disp: , Rfl:    NIFEdipine  (PROCARDIA  XL/NIFEDICAL XL) 60 MG 24 hr tablet, Take 1 tablet (60 mg total) by mouth daily., Disp: 90 tablet, Rfl: 1   Olopatadine-Mometasone (RYALTRIS ) 665-25 MCG/ACT SUSP, Place 2 sprays into the nose 2 (two) times daily as needed., Disp: 29 g, Rfl: 2   ondansetron  (ZOFRAN -ODT) 4 MG disintegrating tablet, Take 1 tablet (4 mg total) by mouth every 8 (eight) hours as needed for nausea or vomiting., Disp: 20 tablet, Rfl: 0   Rimegepant Sulfate  (NURTEC) 75 MG TBDP, Dissolve 1 tablet on the tongue as needed at first sign of migraine, Disp: 15 tablet, Rfl: 2   Ruxolitinib  Phosphate (OPZELURA ) 1.5 % CREA, Apply 1 Application topically 2 (two) times daily as needed., Disp: 60 g, Rfl: 3   triamcinolone cream (KENALOG) 0.1 %, Apply 1 application. topically daily as needed (eczema)., Disp: , Rfl: 6   Medications ordered in this encounter:  No orders of the defined types were placed in this encounter.    *If you need refills on other medications prior to your next appointment, please contact your pharmacy*  Follow-Up: Call back or seek an in-person evaluation  if the symptoms worsen or if the condition fails to improve as anticipated.  [facility] [phone]  Other Instructions Call [facility] today and ask for neuro referral since you are having headaches that feel to you like IIH.   Also take your blood pressure daily and record them - take this to your primacy care provider and the new neurologist. It will help them determine if your blood pressure could be contributing to your headaches.    If you have been instructed to have an in-person evaluation today at a local Urgent Care  facility, please use the link below. It will take you to a list of all of our available [facility], including address, phone number and hours of operation. Please do not delay care.  [facility]  If you or a family member do not have a primary care provider, use the link below to schedule a visit and establish care. When you choose a [facility] primary care physician or advanced practice provider, you gain a long-term partner in health. Find a Primary Care Provider  Learn more about [facility]'s in-office and virtual care options: [facility] - Get Care Now

## 2024-07-31 NOTE — Progress Notes (Signed)
 Virtual Visit Consent   Jane Ellison, you are scheduled for a virtual visit with a [facility] provider today. Just as with appointments in the office, your consent must be obtained to participate. Your consent will be active for this visit and any virtual visit you may have with one of our providers in the next 365 days. If you have a MyChart account, a copy of this consent can be sent to you electronically.  As this is a virtual visit, video technology does not allow for your provider to perform a traditional examination. This may limit your provider's ability to fully assess your condition. If your provider identifies any concerns that need to be evaluated in person or the need to arrange testing (such as labs, EKG, etc.), we will make arrangements to do so. Although advances in technology are sophisticated, we cannot ensure that it will always work on either your end or our end. If the connection with a video visit is poor, the visit may have to be switched to a telephone visit. With either a video or telephone visit, we are not always able to ensure that we have a secure connection.  By engaging in this virtual visit, you consent to the provision of healthcare and authorize for your insurance to be billed (if applicable) for the services provided during this visit. Depending on your insurance coverage, you may receive a charge related to this service.  I need to obtain your verbal consent now. Are you willing to proceed with your visit today? Jane Ellison has provided verbal consent on 07/31/2024 for a virtual visit (video or telephone). Jon CHRISTELLA Belt, NP  Date: 07/31/2024 12:51 PM   Virtual Visit via Video Note   I, Jon CHRISTELLA Belt, connected with  Jane Ellison  ([id number], Feb 28, 1991) on 07/31/24 at 12:45 PM EDT by a video-enabled telemedicine application and verified that I am speaking with the correct person using two identifiers.  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 The patient expressed understanding and agreed to proceed.    History of Present Illness: Jane Ellison is a 33 y.o. who identifies as a female who was assigned female at birth, and is being seen today for headaches 2-3 x/week. Getting them when she wakes up. Started up about a month ago. Feels like when she was dx with IIH, but not as bad - no nausea with current headaches.   2 years ago, had lumbar puncture for IIH, was on diamox .   Had a baby a year ago, had preeclampsia. Wondered if headaches could be from HTN, so checked BP yesterday, was 142/116, went down to 142/89 while at work. Took procardia  last night.   Woke up this morning with headache, took nurtec this morning and it helped a little, but not much. BP today 110/82 this morning at work.   Headaches feel more like IIH, no longer has neurologist, needs one  Frequent urination at night for last week is keeping her awake at night  Had Mirena IUD inserted 06/04/24 wonders if it could be causing headaches.    HPI: HPI  Problems:  Patient Active Problem List   Diagnosis Date Noted   Chronic hypertension affecting pregnancy 07/06/2023   Idiopathic intracranial hypertension 07/28/2021   Indication for care in labor and delivery, antepartum 05/26/2019  Mild intermittent asthma without complication 12/14/2018   [redacted] weeks gestation of pregnancy 12/14/2018   Seasonal and perennial allergic rhinoconjunctivitis of both eyes 12/14/2018   Adverse food reaction 12/14/2018   Pregnant and not yet delivered in second trimester 12/14/2018    Allergies:  Allergies  Allergen Reactions   Other Anaphylaxis, Other (See Comments) and Itching    Tree Nuts Only   Shellfish Allergy Anaphylaxis and Nausea  And Vomiting   Pollen Extract Other (See Comments)    Runny nose - Seasonal Allergies   Iodinated Contrast Media Other (See Comments)   Iodine Other (See Comments)   Medications:  Current Outpatient Medications:    acetaminophen  (TYLENOL ) 325 MG tablet, Take 2 tablets (650 mg total) by mouth every 4 (four) hours as needed (for pain scale < 4)., Disp: 100 tablet, Rfl: 0   albuterol  (VENTOLIN  HFA) 108 (90 Base) MCG/ACT inhaler, Inhale 2 puffs into the lungs every 6 (six) hours as needed for wheezing or shortness of breath., Disp: 6.7 g, Rfl: 2   benzonatate  (TESSALON ) 100 MG capsule, Take 1 capsule (100 mg total) by mouth 2 (two) times daily as needed for cough., Disp: 20 capsule, Rfl: 0   betamethasone  dipropionate (DIPROLENE ) 0.05 % ointment, Apply to scalp 3 times per week as needed and to body twice daily x 2 wk, then daily X 2 wk, then every other day x 2 wk. Not to face. Repeat as needed., Disp: 90 g, Rfl: 3   betamethasone  dipropionate (DIPROLENE ) 0.05 % ointment, Apply topically as directed, Disp: 90 g, Rfl: 3   EPINEPHrine  (EPIPEN  2-PAK) 0.3 mg/0.3 mL IJ SOAJ injection, Inject 0.3 mg into the muscle as needed for anaphylaxis., Disp: 2 each, Rfl: 1   EPINEPHrine  (NEFFY ) 2 MG/0.1ML SOLN, PLACE ONE SPRAY INTO ONE NOSTRIL AS NEEDED FOR ANAPHYLAXIS, Disp: 6 each, Rfl: 1   ibuprofen  (ADVIL ) 600 MG tablet, Take 1 tablet (600 mg total) by mouth every 6 (six) hours., Disp: 30 tablet, Rfl: 0   ketotifen  (ZADITOR ) 0.035 % ophthalmic solution, Place 1 drop into both eyes 2 (two) times daily as needed., Disp: 10 mL, Rfl: 5   loratadine (CLARITIN) 10 MG tablet, Take 10 mg by mouth daily., Disp: , Rfl:    Multiple Vitamin (MULTIVITAMIN WITH MINERALS) TABS tablet, Take 1 tablet by mouth daily., Disp: , Rfl:    NIFEdipine  (PROCARDIA  XL/NIFEDICAL XL) 60 MG 24 hr tablet, Take 1 tablet (60 mg total) by mouth daily., Disp: 90 tablet, Rfl: 1   Olopatadine-Mometasone (RYALTRIS ) 665-25 MCG/ACT SUSP, Place 2  sprays into the nose 2 (two) times daily as needed., Disp: 29 g, Rfl: 2   ondansetron  (ZOFRAN -ODT) 4 MG disintegrating tablet, Take 1 tablet (4 mg total) by mouth every 8 (eight) hours as needed for nausea or vomiting., Disp: 20 tablet, Rfl: 0   Rimegepant Sulfate  (NURTEC) 75 MG TBDP, Dissolve 1 tablet on the tongue as needed at first sign of migraine, Disp: 15 tablet, Rfl: 2   Ruxolitinib  Phosphate (OPZELURA ) 1.5 % CREA, Apply 1 Application topically 2 (two) times daily as needed., Disp: 60 g, Rfl: 3   triamcinolone cream (KENALOG) 0.1 %, Apply 1 application. topically daily as needed (eczema)., Disp: , Rfl: 6  Observations/Objective: Patient is well-developed, well-nourished in no acute distress.  Resting comfortably  at work in parked car.  Head is normocephalic, atraumatic.  No labored breathing.  Speech is clear and coherent with logical content.  Patient is alert and oriented at baseline.  Assessment and Plan: 1. Chronic intractable headache, unspecified headache type (Primary)  I believe she needs to see neuro ASAP given hx of IIH and sx that feel to her like IIH. She will contact PCP today for neuro referral.   Pt will keep track of daily BPs to show neuro/pcp to be able to r/o HTN as cause of headaches.   Follow Up Instructions: I discussed the assessment and treatment plan with the patient. The patient was provided an opportunity to ask questions and all were answered. The patient agreed with the plan and demonstrated an understanding of the instructions.  A copy of instructions were sent to the patient via MyChart unless otherwise noted below.    The patient was advised to call back or seek an in-person evaluation if the symptoms worsen or if the condition fails to improve as anticipated.    Jon CHRISTELLA Belt, NP

## 2024-08-01 DIAGNOSIS — Z30431 Encounter for routine checking of intrauterine contraceptive device: Secondary | ICD-10-CM | POA: Diagnosis not present

## 2024-08-02 ENCOUNTER — Telehealth: Payer: Self-pay | Admitting: Neurology

## 2024-08-02 ENCOUNTER — Encounter: Payer: Self-pay | Admitting: Neurology

## 2024-08-02 ENCOUNTER — Other Ambulatory Visit (HOSPITAL_COMMUNITY): Payer: Self-pay

## 2024-08-02 ENCOUNTER — Ambulatory Visit: Admitting: Neurology

## 2024-08-02 VITALS — BP 135/89 | HR 86 | Ht 62.0 in | Wt 218.0 lb

## 2024-08-02 DIAGNOSIS — R0683 Snoring: Secondary | ICD-10-CM | POA: Diagnosis not present

## 2024-08-02 DIAGNOSIS — R519 Headache, unspecified: Secondary | ICD-10-CM | POA: Diagnosis not present

## 2024-08-02 DIAGNOSIS — E66812 Obesity, class 2: Secondary | ICD-10-CM | POA: Diagnosis not present

## 2024-08-02 DIAGNOSIS — H538 Other visual disturbances: Secondary | ICD-10-CM | POA: Diagnosis not present

## 2024-08-02 DIAGNOSIS — G932 Benign intracranial hypertension: Secondary | ICD-10-CM

## 2024-08-02 MED ORDER — ACETAZOLAMIDE ER 500 MG PO CP12
500.0000 mg | ORAL_CAPSULE | Freq: Two times a day (BID) | ORAL | 3 refills | Status: AC
  Filled 2024-08-02: qty 180, 90d supply, fill #0
  Filled 2024-11-11: qty 180, 90d supply, fill #1

## 2024-08-02 NOTE — Patient Instructions (Signed)
Idiopathic Intracranial Hypertension  Idiopathic intracranial hypertension (IIH) is a condition that increases pressure around the brain. The fluid that surrounds the brain and spinal cord (cerebrospinal fluid, or CSF) increases and causes the pressure. Idiopathic means that the cause of this condition is not known. IIH affects the brain and spinal cord. If this condition is not treated, it can cause vision loss or blindness. What are the causes? The cause of this condition is not known. What increases the risk? The following factors may make you more likely to develop this condition: Being obese. Being a person who is female, between the ages of 76 and 62 years old, and who has not gone through menopause. Taking certain medicines, such as birth control, acne medicines, or steroids. What are the signs or symptoms? Symptoms of this condition include: Headaches. This is the most common symptom. Brief periods of total blindness. Double vision, blurred vision, or poor side (peripheral) vision. Pain in the shoulders or neck. Nausea and vomiting. A sound like rushing water or a pulsing sound within the ears (pulsatile tinnitus), or ringing in the ears. How is this diagnosed? This condition may be diagnosed based on: Your symptoms and medical history. Imaging tests of the brain, such as: CT scan. MRI. Magnetic resonance venogram (MRV) to check the veins. Diagnostic lumbar puncture. This is a procedure to remove and examine a sample of CSF. This procedure can determine whether your fluid pressure is too high. An eye exam to check for swelling or nerve damage in the eyes. How is this treated? Treatment for this condition depends on the symptoms. The goal of treatment is to decrease the pressure around your brain. Common treatments include: Weight loss through healthy eating, salt restriction, and exercise, if you are overweight. Medicines to decrease the production of CSF and lower the pressure  within your skull. Medicines to prevent or treat headaches. Other treatments may include: Surgery to place drains (shunts) in your brain to remove extra fluid. Lumbar puncture to remove extra CSF. Follow these instructions at home: If you are overweight or obese, work with your health care provider to lose weight. Take over-the-counter and prescription medicines only as told by your health care provider. Ask your health care provider if the medicine prescribed to you requires you to avoid driving or using machinery. Do not use any products that contain nicotine or tobacco. These products include cigarettes, chewing tobacco, and vaping devices, such as e-cigarettes. If you need help quitting, ask your health care provider. Keep all follow-up visits. Your health care provider will need to monitor you regularly. Contact a health care provider if: You have changes in your vision, such as: Double vision. Blurred vision. Poor peripheral vision. Get help right away if: You have any of the following symptoms and they get worse or do not get better: Headaches. Nausea. Vomiting. Sudden trouble seeing. This information is not intended to replace advice given to you by your health care provider. Make sure you discuss any questions you have with your health care provider. Document Revised: 02/23/2022 Document Reviewed: 02/02/2022 Elsevier Patient Education  2024 [employer].

## 2024-08-02 NOTE — Telephone Encounter (Signed)
 Referral to Ophthalmology  sent thru Epic to [facility]  Phone [phone]

## 2024-08-02 NOTE — Progress Notes (Signed)
 [facility]  Provider:  Dr Chalice Referring Provider: Jeneal Danita Keeling* Primary Care Physician:  Cristopher Bottcher, NP  Chief Complaint  Patient presents with   IIH    Rm 1 alone Pt is well, reports she is having headaches 2-3 days a week and frequent urination. No other concerns.     HPI:  Jane Ellison is a 33 y.o. female and seen here on 08/02/2024 upon referral from Dr. Jeneal for a Consultation/ Evaluation of IIH.   She carried the diagnosis of IIH about for 3 years,  onset of headaches after birth of her first child , BMI  was about 65,  MRI empty or partially empty sella,  opening pressure as between 26 and 28 cm water,   was placed on Diamox .   Diamox  was stopped by her after headaches had improved.   She delivered her second child in September 2024 and HA have returned, waking up with headaches, then  fatigued and  developing migraine 3-4 times a week responding to NURTEC. BP was elevated  in the last 4 months , BMI remaining elevated at 40 . She has not found time to see the eye doctor this year but must, she has trouble reading the alarm clock.   This patient reports onset of recurrent and increasing HA/ migraines and morning onset headaches,  over a period of 12 months. She also has more frequent nocturia.    Jane Ellison previous work up is quoted below.  She also may have sleep apnea, snoring,  headaches.  She snores and she gets only 6 hours of sleep or less  each night     Review of Systems: Out of a complete 14 system review, the patient complains of only the following symptoms, and all other reviewed systems are negative.  How likely are you to doze in the following situations: 0 = not likely, 1 = slight chance, 2 = moderate chance, 3 = high chance  Sitting and Reading? Watching Television? Sitting inactive in a public place (theater or meeting)? Lying down in the afternoon when circumstances permit? Sitting and talking to  someone? Sitting quietly after lunch without alcohol? In a car, while stopped for a few minutes in traffic? As a passenger in a car for an hour without a break?  Total = 12  Social History   Socioeconomic History   Marital status: Married    Spouse name: Delrus   Number of children: 1   Years of education: Not on file   Highest education level: Not on file  Occupational History   Not on file  Tobacco Use   Smoking status: Never   Smokeless tobacco: Never  Vaping Use   Vaping status: Never Used  Substance and Sexual Activity   Alcohol use: Yes    Alcohol/week: 1.0 - 2.0 standard drink of alcohol    Types: 1 - 2 Glasses of wine per week   Drug use: Never   Sexual activity: Yes    Birth control/protection: None    Comment: pregnant  Other Topics Concern   Not on file  Social History Narrative   Not on file   Social Drivers of Health   Financial Resource Strain: Not on file  Food Insecurity: No Food Insecurity (07/06/2023)   Hunger Vital Sign    Worried About Running Out of Food in the Last Year: Never true    Ran Out of Food in the Last Year: Never true  Transportation Needs: No Transportation  Needs (07/06/2023)   PRAPARE - Administrator, Civil Service (Medical): No    Lack of Transportation (Non-Medical): No  Physical Activity: Not on file  Stress: Not on file  Social Connections: Not on file  Intimate Partner Violence: Not At Risk (07/06/2023)   Humiliation, Afraid, Rape, and Kick questionnaire    Fear of Current or Ex-Partner: No    Emotionally Abused: No    Physically Abused: No    Sexually Abused: No    Family History  Problem Relation Age of Onset   Hypertension Mother    Allergy (severe) Mother        Grass   Hypertension Maternal Grandmother    Hyperlipidemia Maternal Grandmother    Diabetes Maternal Grandmother    Hypertension Maternal Grandfather    Hyperlipidemia Maternal Grandfather    Diabetes Maternal Grandfather    Hypertension  Paternal Grandmother    Hypertension Paternal Grandfather    Allergic rhinitis Neg Hx    Angioedema Neg Hx    Asthma Neg Hx    Atopy Neg Hx    Eczema Neg Hx    Immunodeficiency Neg Hx    Urticaria Neg Hx     Past Medical History:  Diagnosis Date   Angio-edema    Asthma    rarely uses inhaler   Eczema    Head ache    History of pre-eclampsia in prior pregnancy, currently pregnant    Hyperlipidemia    no meds, diet controlled   Pre-eclampsia    Seasonal allergies     Past Surgical History:  Procedure Laterality Date   CHOLECYSTECTOMY N/A 01/04/2022   Procedure: LAPAROSCOPIC CHOLECYSTECTOMY;  Surgeon: Curvin Mt III, MD;  Location: [facility] OR;  Service: General;  Laterality: N/A;   WISDOM TOOTH EXTRACTION      Current Outpatient Medications  Medication Sig Dispense Refill   acetaminophen  (TYLENOL ) 325 MG tablet Take 2 tablets (650 mg total) by mouth every 4 (four) hours as needed (for pain scale < 4). 100 tablet 0   albuterol  (VENTOLIN  HFA) 108 (90 Base) MCG/ACT inhaler Inhale 2 puffs into the lungs every 6 (six) hours as needed for wheezing or shortness of breath. 6.7 g 2   benzonatate  (TESSALON ) 100 MG capsule Take 1 capsule (100 mg total) by mouth 2 (two) times daily as needed for cough. 20 capsule 0   betamethasone  dipropionate (DIPROLENE ) 0.05 % ointment Apply to scalp 3 times per week as needed and to body twice daily x 2 wk, then daily X 2 wk, then every other day x 2 wk. Not to face. Repeat as needed. 90 g 3   betamethasone  dipropionate (DIPROLENE ) 0.05 % ointment Apply topically as directed 90 g 3   EPINEPHrine  (EPIPEN  2-PAK) 0.3 mg/0.3 mL IJ SOAJ injection Inject 0.3 mg into the muscle as needed for anaphylaxis. 2 each 1   EPINEPHrine  (NEFFY ) 2 MG/0.1ML SOLN Place 2 sprays into both nostrils as needed (ANAPHYLAXIS). 6 each 1   ibuprofen  (ADVIL ) 600 MG tablet Take 1 tablet (600 mg total) by mouth every 6 (six) hours. 30 tablet 0   ketotifen  (ZADITOR ) 0.035 % ophthalmic  solution Place 1 drop into both eyes 2 (two) times daily as needed. 10 mL 5   loratadine (CLARITIN) 10 MG tablet Take 10 mg by mouth daily.     Multiple Vitamin (MULTIVITAMIN WITH MINERALS) TABS tablet Take 1 tablet by mouth daily.     NIFEdipine  (PROCARDIA  XL/NIFEDICAL XL) 60 MG 24 hr tablet Take 1  tablet (60 mg total) by mouth daily. 90 tablet 1   Olopatadine-Mometasone (RYALTRIS ) 665-25 MCG/ACT SUSP Place 2 sprays into the nose 2 (two) times daily as needed. 29 g 2   ondansetron  (ZOFRAN -ODT) 4 MG disintegrating tablet Take 1 tablet (4 mg total) by mouth every 8 (eight) hours as needed for nausea or vomiting. 20 tablet 0   Rimegepant Sulfate  (NURTEC) 75 MG TBDP Dissolve 1 tablet on the tongue as needed at first sign of migraine 15 tablet 2   Ruxolitinib  Phosphate (OPZELURA ) 1.5 % CREA Apply 1 Application topically 2 (two) times daily as needed. 60 g 3   triamcinolone cream (KENALOG) 0.1 % Apply 1 application. topically daily as needed (eczema).  6   No current facility-administered medications for this visit.    Allergies as of 08/02/2024 - Review Complete 08/02/2024  Allergen Reaction Noted   Other Anaphylaxis, Other (See Comments), and Itching 12/14/2018   Shellfish allergy Anaphylaxis and Nausea And Vomiting 08/22/2014   Pollen extract Other (See Comments) 07/28/2021   Iodinated contrast media Other (See Comments) 10/02/2015   Iodine Other (See Comments) 12/02/2021    Vitals: BP 135/89   Pulse 86   Ht 5' 2 (1.575 m)   Wt 218 lb (98.9 kg)   BMI 39.87 kg/m   @No  data found.     @VITALSLAST3  [[id number]]@ Physical exam:  General: The patient is awake, alert and appears not in acute distress.   Desribed a pressure from the inside of the forehead.  No tinnitus.  The patient is well groomed. Head: Normocephalic, atraumatic.  Neck is supple.  Neck circumference:15.25 Cardiovascular:  Regular rate and palpable peripheral pulse:  Respiratory: clear to auscultation.  Mallampati  2 with enormous uvula , Nasal airway obstructed, congested.  Skin:  Without evidence of edema, or rash Trunk: obese    Neurologic exam : The patient is awake and alert, oriented to place and time.   Memory subjective  described as intact.  There is a normal attention span & concentration ability.  Speech is fluent without  dysarthria, dysphonia or aphasia.  Mood and affect are appropriate.  Cranial nerves: Pupils are equal and briskly reactive to light.  Funduscopic exam no evidence of pallor , possibly mild palpill edema.  Extraocular movements  in vertical and horizontal planes intact and without nystagmus. Visual fields by finger perimetry are intact. Hearing to finger rub intact.   Facial sensation intact to fine touch. Facial motor strength is symmetric and tongue and uvula move midline.  Motor exam:   Normal tone and normal muscle bulk and symmetric normal strength in all extremities. Grip Strength is preserved.  Proximal strength of shoulder muscles and hip flexors was intact .  Sensory:  hands will go numb. Fine touch and vibration were tested .  Proprioception was tested in the upper extremities only and was  normal.  Coordination: Rapid alternating movements in the fingers/hands were normal.  Finger-to-nose maneuver was tested and showed no evidence of ataxia, dysmetria or tremor.  Gait and station: Patient walked with/ without assistive device .  Core Strength within normal limits.  Stance is stable and of normal. Base.  SABRA   Deep tendon reflexes: in the  upper and lower extremities are symmetric and  brisk Babinski maneuver response is  downgoing.   Assessment: Total time for face to face interview and examination, for review of  images and laboratory testing, neurophysiology testing and pre-existing records, including out-of -network , preparation in chart review  was  45 minutes. Assessment is as follows here:  1)   recurrence of migraines  12- 15 a months ,  morning onset , in the setting of IIH.  2)  current chronic migraine , 15 vents a month,  needing Nurtec  ( provided  by PCP>   Plan:  Treatment plan and additional workup planned after today includes:   1)  MRI brain for pre for  LP.   2) LP for  opening pressure , fluid removal .   3) diamox  prescription to start right after LP.  500 mg bid.    risk factor is weight , BMI 40, and also OSA can provoke more morning headaches.    HST / PSG ordered.   Ophthalmology- Dr Octavia. \\ . CMET and CBC today.   Rv in 2-3 months with results of MRI, CSF pressure and started on Diamox .    The patient's condition requires frequent monitoring and adjustments in the treatment plan, reflecting the ongoing complexity of care.  This provider is the continuing focal point for all needed services for this condition.   Dedra Gores, MD  [facility] and [facility] Board certified by The [employer] of Sleep Medicine and Diplomate of the [employer] of Sleep Medicine. Board certified In Neurology through the ABPN, Fellow of the [employer] of Neu[employer].

## 2024-08-03 ENCOUNTER — Ambulatory Visit: Payer: Self-pay | Admitting: Neurology

## 2024-08-03 LAB — COMPREHENSIVE METABOLIC PANEL WITH GFR
ALT: 12 IU/L (ref 0–32)
AST: 13 IU/L (ref 0–40)
Albumin: 4.2 g/dL (ref 3.9–4.9)
Alkaline Phosphatase: 120 IU/L — ABNORMAL HIGH (ref 41–116)
BUN/Creatinine Ratio: 14 (ref 9–23)
BUN: 10 mg/dL (ref 6–20)
Bilirubin Total: 0.2 mg/dL (ref 0.0–1.2)
CO2: 24 mmol/L (ref 20–29)
Calcium: 9.2 mg/dL (ref 8.7–10.2)
Chloride: 101 mmol/L (ref 96–106)
Creatinine, Ser: 0.69 mg/dL (ref 0.57–1.00)
Globulin, Total: 3.1 g/dL (ref 1.5–4.5)
Glucose: 92 mg/dL (ref 70–99)
Potassium: 4 mmol/L (ref 3.5–5.2)
Sodium: 141 mmol/L (ref 134–144)
Total Protein: 7.3 g/dL (ref 6.0–8.5)
eGFR: 118 mL/min/1.73 (ref >59)

## 2024-08-03 LAB — CBC WITH DIFFERENTIAL/PLATELET
Basophils Absolute: 0 x10E3/uL (ref 0.0–0.2)
Basos: 1 %
EOS (ABSOLUTE): 0.4 x10E3/uL (ref 0.0–0.4)
Eos: 8 %
Hematocrit: 41.7 % (ref 34.0–46.6)
Hemoglobin: 13.4 g/dL (ref 11.1–15.9)
Immature Grans (Abs): 0 x10E3/uL (ref 0.0–0.1)
Immature Granulocytes: 0 %
Lymphocytes Absolute: 1.8 x10E3/uL (ref 0.7–3.1)
Lymphs: 34 %
MCH: 28.4 pg (ref 26.6–33.0)
MCHC: 32.1 g/dL (ref 31.5–35.7)
MCV: 88 fL (ref 79–97)
Monocytes Absolute: 0.5 x10E3/uL (ref 0.1–0.9)
Monocytes: 10 %
Neutrophils Absolute: 2.4 x10E3/uL (ref 1.4–7.0)
Neutrophils: 47 %
Platelets: 308 x10E3/uL (ref 150–450)
RBC: 4.72 x10E6/uL (ref 3.77–5.28)
RDW: 13.9 % (ref 11.7–15.4)
WBC: 5.2 x10E3/uL (ref 3.4–10.8)

## 2024-08-06 ENCOUNTER — Ambulatory Visit (HOSPITAL_COMMUNITY)
Admission: RE | Admit: 2024-08-06 | Discharge: 2024-08-06 | Disposition: A | Discharge status: Home / Self Care | Source: Ambulatory Visit | Attending: Neurology | Admitting: Neurology

## 2024-08-06 DIAGNOSIS — R0683 Snoring: Secondary | ICD-10-CM | POA: Insufficient documentation

## 2024-08-06 DIAGNOSIS — R519 Headache, unspecified: Secondary | ICD-10-CM | POA: Diagnosis not present

## 2024-08-06 DIAGNOSIS — G932 Benign intracranial hypertension: Secondary | ICD-10-CM | POA: Insufficient documentation

## 2024-08-06 DIAGNOSIS — Q048 Other specified congenital malformations of brain: Secondary | ICD-10-CM | POA: Diagnosis not present

## 2024-08-06 DIAGNOSIS — H538 Other visual disturbances: Secondary | ICD-10-CM | POA: Insufficient documentation

## 2024-08-06 DIAGNOSIS — E66812 Obesity, class 2: Secondary | ICD-10-CM | POA: Diagnosis not present

## 2024-08-06 MED ORDER — HEPARIN SOD (PORK) LOCK FLUSH 100 UNIT/ML IV SOLN: 500.0000 [IU] | Freq: Once | INTRAVENOUS | Status: DC

## 2024-08-06 MED ORDER — GADOBUTROL 1 MMOL/ML IV SOLN
7.0000 mL | Freq: Once | INTRAVENOUS | Status: AC | PRN
Start: 2024-08-06 — End: 2024-08-06
  Administered 2024-08-06: 9 mL via INTRAVENOUS

## 2024-08-09 ENCOUNTER — Other Ambulatory Visit: Payer: Self-pay | Admitting: Medical Genetics

## 2024-08-09 ENCOUNTER — Ambulatory Visit (HOSPITAL_COMMUNITY)
Admission: RE | Admit: 2024-08-09 | Discharge: 2024-08-09 | Disposition: A | Discharge status: Home / Self Care | Source: Ambulatory Visit | Attending: Neurology | Admitting: Neurology

## 2024-08-09 ENCOUNTER — Encounter (HOSPITAL_COMMUNITY): Payer: Self-pay | Admitting: Radiology

## 2024-08-09 DIAGNOSIS — R519 Headache, unspecified: Secondary | ICD-10-CM | POA: Diagnosis not present

## 2024-08-09 DIAGNOSIS — R0683 Snoring: Secondary | ICD-10-CM | POA: Insufficient documentation

## 2024-08-09 DIAGNOSIS — H538 Other visual disturbances: Secondary | ICD-10-CM | POA: Diagnosis not present

## 2024-08-09 DIAGNOSIS — E66812 Obesity, class 2: Secondary | ICD-10-CM | POA: Insufficient documentation

## 2024-08-09 DIAGNOSIS — G932 Benign intracranial hypertension: Secondary | ICD-10-CM | POA: Diagnosis not present

## 2024-08-09 LAB — PROTEIN AND GLUCOSE, CSF
Glucose, CSF: 55 mg/dL (ref 40–70)
Total  Protein, CSF: 15 mg/dL (ref 15–45)

## 2024-08-09 LAB — CSF CELL COUNT WITH DIFFERENTIAL
RBC Count, CSF: 65 /mm3 — ABNORMAL HIGH
Tube #: 1
WBC, CSF: 2 /mm3 (ref 0–5)

## 2024-08-09 MED ORDER — ACETAMINOPHEN 325 MG PO TABS: 650.0000 mg | ORAL_TABLET | ORAL | Status: DC | PRN

## 2024-08-09 MED ORDER — LIDOCAINE 1 % OPTIME INJ - NO CHARGE
5.0000 mL | Freq: Once | INTRAMUSCULAR | Status: AC
Start: 2024-08-09 — End: 2024-08-09
  Administered 2024-08-09: 5 mL via INTRADERMAL
  Filled 2024-08-09: qty 6

## 2024-08-09 NOTE — Procedures (Signed)
 PROCEDURE SUMMARY:  Successful fluoroscopic guided lumbar puncture.  Opening pressure was 23 cm H2O. Closing pressure was 9 cm H2O. All values measured in lateral decubitus.  ~29 mL clear colorless fluid collected and sent for labs.  No immediate complications.  Pt tolerated well.   EBL = none  Please see full dictation in imaging section of Epic for procedure details.   Byrd Rushlow NP 08/09/2024 1:51 PM

## 2024-08-09 NOTE — Progress Notes (Signed)
 Patient and patient husband given discharge instructions, education provided no further questions at this time. Patient able to ambulate and void before discharge. Able to tolerate PO intake. Patient site is clean, dry, intact with upon discharge. Orders verified with NP Bridgett Huneycutt, labs sent down, verified labs do not need to be back before discharge.

## 2024-08-12 LAB — CSF CULTURE W GRAM STAIN
Culture: NO GROWTH
Gram Stain: NONE SEEN

## 2024-08-13 ENCOUNTER — Ambulatory Visit: Payer: Self-pay | Admitting: Neurology

## 2024-08-14 NOTE — Progress Notes (Signed)
 Also have this result note from Dr Dohmeier:  CSF was sent for lab tests, all normal ( normal protein, no bacteria, etc) .    The Opening pressure was elevated, confirming dx of  IIH=Idiopathic Intracranial Hypertension) and   Diamox  was prescribed to bring the fluid pressure down-  please take med as prescribed.     Keep appointment with  Ophthalmologist for visual field test and retinal pictures.  RV with NP at [facility]  in 6-8 months.

## 2024-08-14 NOTE — Progress Notes (Signed)
 See phone note

## 2024-08-14 NOTE — Telephone Encounter (Signed)
-----   Message from Bethpage Dohmeier sent at 08/09/2024  8:56 PM EDT ----- High opening pressure of 23 cm water was reduced to 9 cm water ( normal ) by spinal tap.   Need to start a fluid reducing medication.   Diamox  500 mg twice a day, po.   prescribed 08-07-2024  ----- Message ----- From: Interface, Rad Results In Sent: 08/09/2024   4:49 PM EDT To: Dedra Gores, MD

## 2024-08-14 NOTE — Telephone Encounter (Signed)
 Spoke with patient and discussed LP results as noted below by Dr Chalice. Patient verbalized understanding and stated she started Diamox  500 mg BID approx 08/08/24. She has started noticing s/e of increased urination. We discussed some other possible s/e  pt may notice. She will f/u with jessica NP on 02/28/25 at 745 am arrive 730. She thanked me for the call.

## 2024-08-14 NOTE — Telephone Encounter (Signed)
 Called pt to discuss LP results. She asked for call back in about 30 min.

## 2024-08-15 ENCOUNTER — Telehealth: Payer: Self-pay | Admitting: Neurology

## 2024-08-15 LAB — OLIGOCLONAL BANDS, CSF + SERM

## 2024-08-15 NOTE — Telephone Encounter (Signed)
 Sent a mychart message to the patient.

## 2024-08-15 NOTE — Telephone Encounter (Signed)
 [facility] aet[employer] no auth req/ [facility] pending

## 2024-08-20 NOTE — Telephone Encounter (Signed)
 Checked status on the portal it is still pending

## 2024-08-23 NOTE — Telephone Encounter (Signed)
 [facility] aet[employer] no auth req/ [facility]: [id number] (exp. 08/15/24 to 10/10/24)

## 2024-08-29 ENCOUNTER — Ambulatory Visit: Admitting: Neurology

## 2024-08-29 DIAGNOSIS — H538 Other visual disturbances: Secondary | ICD-10-CM

## 2024-08-29 DIAGNOSIS — E66812 Obesity, class 2: Secondary | ICD-10-CM

## 2024-08-29 DIAGNOSIS — R519 Headache, unspecified: Secondary | ICD-10-CM

## 2024-08-29 DIAGNOSIS — G932 Benign intracranial hypertension: Secondary | ICD-10-CM

## 2024-08-29 DIAGNOSIS — G4733 Obstructive sleep apnea (adult) (pediatric): Secondary | ICD-10-CM | POA: Diagnosis not present

## 2024-08-29 DIAGNOSIS — R0683 Snoring: Secondary | ICD-10-CM

## 2024-08-30 NOTE — Progress Notes (Signed)
 [facility]  Jane Ellison 33 year old female 1991-04-06   HOME SLEEP TEST REPORT ( by [facility] PAT)   STUDY DATE: 08-29-2024 / data load 08-30-2024    ORDERING CLINICIAN:  Dedra Gores, MD  REFERRING CLINICIAN: Referred by Jeneal Danita Macintosh, MD  Primary Care Physician:  Cristopher Bottcher, NP    CLINICAL INFORMATION/HISTORY: patient with intracranial Hypertension history,  referred by Dr Jeneal, MD Jane Celestial Barnfield is a 33 y.o. female and seen here on 08/02/2024 upon referral from Dr. Jeneal for a Consultation/ Evaluation of IIH.   She carried the diagnosis of IIH about for 3 years,  onset of headaches after birth of her first child , BMI  was about 50,  MRI empty or partially empty sella,  opening pressure as between 26 and 28 cm water,   was placed on Diamox .   Diamox  was stopped by her after headaches had improved.    She delivered her second child in September 2024 and HA have returned, waking up with headaches, being fatigued and  developing migraine 3-4 times a week , these are responding to NURTEC. BP was elevated  in the last 4 months , BMI remaining elevated at 40 .She has not found time to see the eye doctor this year but must, she reportedly has trouble reading the alarm clock.  This patient reports onset of recurrent and increasing HA/ migraines and morning onset headaches,  over a period of 12 months. She also has more frequent nocturia.  She also may have sleep apnea, snoring, headaches.  She snores and she gets only 6 hours of sleep or less each night and  recurrence of migraines 15 a months , morning onset  in the setting of suspected IIH.  These are currently chronic migraines, 15 days a month, controlled by Nurtec  ( provided  by PCP)>    Plan:  Treatment plan and additional workup planned after today includes:    1)  MRI brain for pre for  LP.  2) LP for  opening pressure , fluid removal .  3) diamox  prescription to start  right after LP IIH and OSA risk factor is weight , BMI 40, and Hypoxia / OSA can provoke more morning headaches.     HST / PSG ordered.   Ophthalmology- Dr Octavia. \\ . CMET and CBC today.   Epworth sleepiness score: 12 /24.   BMI:  39.9 kg/m   Neck circumference:15.25    FINDINGS:   Total Recording Time (hours, min):   6 h 6 minutes      Total Sleep Time (hours, min):   5 h 35 m     with a technically valid sleep time of less than 3 hours /        Percent REM (%):  22%                                       Respiratory Indices:   Calculated pAHI (per AASM guideline): 3.3/h                          REM pAHI:    7.5/h  NREM pAHI:    2.3/h                           Positional AHI:    supine AHI was 4.4/h   Snoring:  just above threshold of detection ( 40 dB ) and only present for 14 % of sleep time.                                               Oxygen Saturation Statistics:   Oxygen Saturation (%) Mean:   96     O2 Saturation Range (%):   89-98%                                    O2 Saturation (minutes) <89%:    0 minutes       Pulse Rate Statistics:   Pulse Mean (bpm):    69 bpm             Pulse Range:   Between  51 and 106 bpm            IMPRESSION:  This HST did not detect sleep apnea nor sleep hypoxia. There were more apneas/ hypopneas seen during supine  REM sleep, but even during these periods was there no clinically significant apnea present.    RECOMMENDATION: Sleep architecture showed low fragmentation and fairly uninterrupted sleep for 5 hours, with a  normal distribution of REM and NREM sleep .  No apnea of significance, no clinically significant snoring.  Avoiding supine sleep may help to reduce the few apneas further.  CPAP or dental device are not needed.  Please advise the patient to allocate enough hours to allow more sleep, at least 7 hours per 24 hours.    Any patient should be cautioned not to drive,  work at heights, or operate dangerous or heavy equipment when tired or sleepy.   Review of good sleep hygiene measures is accessible to any sleep clinic patient and can be reiterated through online material- I we recommend the Guide to better Sleep   by the NIH.   Weight loss and Core Strength improvement is highly recommended for individuals with low muscle tone and/ or a BMI over 30.   Sleep fragmentation in the presence of normal proportional sleep stages is a nonspecific findings and per se does not signify an intrinsic sleep disorder or a cause for the patient's sleep-related symptoms.  Causes include (but are not limited to) the unfamiliarity of sleeping while recorded by HST device or sleeping in a sleep lab for a full Polysomnography sleep study, but also circadian rhythm disturbances, medication side effects or an underlying mood disorder or medical problem.   The referring physician will be notified of the test results.       INTERPRETING PHYSICIAN:   Dedra Gores, MD  [facility] and [facility] Sleep Board certified by The [employer] of Sleep Medicine and  Diplomate of the [employer] of Sleep Medicine. Board certified In Neurology through the ABPN,  Fellow of the [employer] Neu[employer].

## 2024-09-03 ENCOUNTER — Encounter: Payer: Self-pay | Admitting: Neurology

## 2024-09-03 ENCOUNTER — Other Ambulatory Visit

## 2024-09-04 ENCOUNTER — Other Ambulatory Visit: Payer: Self-pay | Admitting: Allergy

## 2024-09-04 DIAGNOSIS — J302 Other seasonal allergic rhinitis: Secondary | ICD-10-CM

## 2024-09-04 DIAGNOSIS — H1013 Acute atopic conjunctivitis, bilateral: Secondary | ICD-10-CM

## 2024-09-10 DIAGNOSIS — J301 Allergic rhinitis due to pollen: Secondary | ICD-10-CM | POA: Diagnosis not present

## 2024-09-10 DIAGNOSIS — J3081 Allergic rhinitis due to animal (cat) (dog) hair and dander: Secondary | ICD-10-CM | POA: Diagnosis not present

## 2024-09-10 DIAGNOSIS — J3089 Other allergic rhinitis: Secondary | ICD-10-CM | POA: Diagnosis not present

## 2024-09-10 DIAGNOSIS — J302 Other seasonal allergic rhinitis: Secondary | ICD-10-CM | POA: Diagnosis not present

## 2024-09-10 NOTE — Progress Notes (Signed)
 Aeroallergen Immunotherapy  Ordering Provider: Danita Brain, MD  Patient Details Name: Annalise Mcdiarmid MRN: [id number] Date of Birth: [birth year]  Order 2 of 2  Vial Label: mite, mold, roach  0.2 ml (Volume)  1:20 Concentration -- Alternaria alternata 0.2 ml (Volume)  1:20 Concentration -- Cladosporium herbarum 0.2 ml (Volume)  1:10 Concentration -- Aspergillus mix 0.2 ml (Volume)  1:10 Concentration -- Penicillium mix 0.5 ml (Volume)   AU Concentration -- Mite Mix (DF 5,000 & DP 5,000) 0.3 ml (Volume)  1:20 Concentration -- Cockroach, German   1.6  ml Extract Subtotal 3.4  ml Diluent  5.0  ml Maintenance Total  Schedule: Silver Vial (1:10,000): RUSH Green Vial (1:1,000): RUSH Blue Vial (1:100): RUSH Yellow Vial (1:10): Schedule B (6 doses) Red Vial (1:1): Schedule A (14 doses)  Special Instructions: RUSH in [place].  After completion of the first Red Vial, please space to every two weeks. After completion of the second Red Vial, please space to every 4 weeks. Ok to up dose new vials on Schedule D. Ok to come twice weekly, if desired, as long as there is 48 hours between injections.

## 2024-09-10 NOTE — Progress Notes (Signed)
 VIALS MADE ON 09/10/24

## 2024-09-10 NOTE — Progress Notes (Signed)
 Aeroallergen Immunotherapy   Ordering Provider: Danita Brain, MD   Patient Details  Name: Jane Ellison  MRN: [id number]  Date of Birth: 02-23-1991   Order 1 of 1   Vial Label: pollen, pet   0.3 ml (Volume)  BAU Concentration -- 7 Grass Mix* 100,000 ([place]  Blue, [place], Orchard, Perennial Rye, RedTop, Sweet Vernal, Timothy)  0.3 ml (Volume)  BAU Concentration -- [place] 10,000  0.2 ml (Volume)  1:20 Concentration -- Johnson  0.3 ml (Volume)  1:20 Concentration -- Ragweed Mix  0.5 ml (Volume)  1:20 Concentration -- Weed Mix*  0.5 ml (Volume)  1:20 Concentration -- Eastern 10 Tree Mix*  0.2 ml (Volume)  1:10 Concentration -- Cedar, red  0.2 ml (Volume)  1:10 Concentration -- Pecan Pollen  0.5 ml (Volume)  1:10 Concentration -- Cat Hair  0.5 ml (Volume)  1:10 Concentration -- Dog Epithelia    3.5  ml Extract Subtotal  1.5  ml Diluent   5.0  ml Maintenance Total   Schedule:  Silver Vial (1:10,000): RUSH  Green Vial (1:1,000): RUSH  Blue Vial (1:100): RUSH  Yellow Vial (1:10): Schedule B (6 doses)  Red Vial (1:1): Schedule A (14 doses)   Special Instructions: [facility] in [place].    After completion of the first Red Vial, please space to every two weeks. After completion of the second Red Vial, please space to every 4 weeks. Ok to up dose new vials on Schedule D. Ok to come twice weekly, if desired, as long as there is 48 hours between injections.

## 2024-09-13 ENCOUNTER — Other Ambulatory Visit (HOSPITAL_COMMUNITY): Payer: Self-pay

## 2024-09-13 MED ORDER — CLINDAMYCIN PHOSPHATE 1 % EX SOLN
1.0000 | Freq: Two times a day (BID) | CUTANEOUS | 4 refills | Status: AC
  Filled 2024-09-13: qty 60, 30d supply, fill #0

## 2024-09-15 NOTE — Procedures (Signed)
 [facility]  Jane Ellison 33 year old female [birth year]   HOME SLEEP TEST REPORT ( by Watch PAT)   STUDY DATE: 08-29-2024 / data load 08-30-2024    ORDERING CLINICIAN:  Dedra Gores, MD  REFERRING CLINICIAN: Referred by Jane Danita Macintosh, MD  Primary Care Physician:  Jane Bottcher, NP    CLINICAL INFORMATION/HISTORY: patient with intracranial Hypertension history,  referred by Dr Jeneal, MD Jane Ellison is a 33 y.o. female and seen here on 08/02/2024 upon referral from Dr. Jeneal for a Consultation/ Evaluation of IIH.   She carried the diagnosis of IIH about for 3 years,  onset of headaches after birth of her first child , BMI  was about 39,  MRI empty or partially empty sella,  opening pressure as between 26 and 28 cm water,   was placed on Diamox .   Diamox  was stopped by her after headaches had improved.    She delivered her second child in September 2024 and HA have returned, waking up with headaches, being fatigued and  developing migraine 3-4 times a week , these are responding to NURTEC. BP was elevated  in the last 4 months , BMI remaining elevated at 40 .She has not found time to see the eye doctor this year but must, she reportedly has trouble reading the alarm clock.  This patient reports onset of recurrent and increasing HA/ migraines and morning onset headaches,  over a period of 12 months. She also has more frequent nocturia.  She also may have sleep apnea, snoring, headaches.  She snores and she gets only 6 hours of sleep or less each night and  recurrence of migraines 15 a months , morning onset  in the setting of suspected IIH.  These are currently chronic migraines, 15 days a month, controlled by Nurtec  ( provided  by PCP)>    Plan:  Treatment plan and additional workup planned after today includes:    1)  MRI brain for pre for  LP.  2) LP for  opening pressure , fluid removal .  3) diamox  prescription to start  right after LP IIH and OSA risk factor is weight , BMI 40, and Hypoxia / OSA can provoke more morning headaches.     HST / PSG ordered.   Ophthalmology- Dr Jane Ellison. \\ . CMET and CBC today.   Epworth sleepiness score: 12 /24.   BMI:  39.9 kg/m   Neck circumference:15.25    FINDINGS:   Total Recording Time (hours, min):   6 h 6 minutes      Total Sleep Time (hours, min):   5 h 35 m     with a technically valid sleep time of less than 3 hours /        Percent REM (%):  22%                                       Respiratory Indices:   Calculated pAHI (per AASM guideline): 3.3/h                          REM pAHI:    7.5/h  NREM pAHI:    2.3/h                           Positional AHI:    supine AHI was 4.4/h   Snoring:  just above threshold of detection ( 40 dB ) and only present for 14 % of sleep time.                                               Oxygen Saturation Statistics:   Oxygen Saturation (%) Mean:   96     O2 Saturation Range (%):   89-98%                                    O2 Saturation (minutes) <89%:    0 minutes       Pulse Rate Statistics:   Pulse Mean (bpm):    69 bpm             Pulse Range:   Between  51 and 106 bpm            IMPRESSION:  This HST did not detect sleep apnea nor sleep hypoxia. There were more apneas/ hypopneas seen during supine  REM sleep, but even during these periods was there no clinically significant apnea present.    RECOMMENDATION: Sleep architecture showed low fragmentation and fairly uninterrupted sleep for 5 hours, with a  normal distribution of REM and NREM sleep .  No apnea of significance, no clinically significant snoring.  Avoiding supine sleep may help to reduce the few apneas further.  CPAP or dental device are not needed.  Please advise the patient to allocate enough hours to allow more sleep, at least 7 hours per 24 hours.    Any patient should be cautioned not to drive,  work at heights, or operate dangerous or heavy equipment when tired or sleepy.   Review of good sleep hygiene measures is accessible to any sleep clinic patient and can be reiterated through online material- I we recommend the Guide to better Sleep   by the NIH.   Weight loss and Core Strength improvement is highly recommended for individuals with low muscle tone and/ or a BMI over 30.   Sleep fragmentation in the presence of normal proportional sleep stages is a nonspecific findings and per se does not signify an intrinsic sleep disorder or a cause for the patient's sleep-related symptoms.  Causes include (but are not limited to) the unfamiliarity of sleeping while recorded by HST device or sleeping in a sleep lab for a full Polysomnography sleep study, but also circadian rhythm disturbances, medication side effects or an underlying mood disorder or medical problem.   The referring physician will be notified of the test results.       INTERPRETING PHYSICIAN:   Dedra Gores, MD  [facility] and [facility] Sleep Board certified by [employer] of Sleep Medicine and  Diplomate of the [employer]. Board certified In Neurology through the ABPN,  Fellow of the [employer].     Jane Ellison, [place] Nurse Tech 08/30/2024   Edit   Copy               [facility]  at [facility]   Jane Ellison 33 year old female 1991/05/11   HOME SLEEP TEST REPORT ( by Watch PAT)   STUDY DATE: 08-29-2024 / data load 08-30-2024    ORDERING CLINICIAN:  Dedra Gores, MD  REFERRING CLINICIAN: Referred by Jane Danita Macintosh, MD  Primary Care Physician:  Jane Bottcher, NP      CLINICAL INFORMATION/HISTORY: patient with intracranial Hypertension history,  referred by Dr Jeneal, MD Jane Ellison is a 33 y.o. female and seen here on 08/02/2024 upon referral from Dr. Jeneal for a Consultation/ Evaluation of  IIH.   She carried the diagnosis of IIH about for 3 years,  onset of headaches after birth of her first child , BMI  was about 18,  MRI empty or partially empty sella,  opening pressure as between 26 and 28 cm water,   was placed on Diamox .   Diamox  was stopped by her after headaches had improved.    She delivered her second child in September 2024 and HA have returned, waking up with headaches, being fatigued and  developing migraine 3-4 times a week , these are responding to NURTEC. BP was elevated  in the last 4 months , BMI remaining elevated at 40 .She has not found time to see the eye doctor this year but must, she reportedly has trouble reading the alarm clock.  This patient reports onset of recurrent and increasing HA/ migraines and morning onset headaches,  over a period of 12 months. She also has more frequent nocturia.  She also may have sleep apnea, snoring, headaches.  She snores and she gets only 6 hours of sleep or less each night and  recurrence of migraines 15 a months , morning onset  in the setting of suspected IIH.  These are currently chronic migraines, 15 days a month, controlled by Nurtec  ( provided  by PCP)>    Plan:  Treatment plan and additional workup planned after today includes:    1)  MRI brain for pre for  LP.  2) LP for  opening pressure , fluid removal .  3) diamox  prescription to start right after LP IIH and OSA risk factor is weight , BMI 40, and Hypoxia / OSA can provoke more morning headaches.     HST / PSG ordered.   Ophthalmology- Dr Jane Ellison. \\ . CMET and CBC today.   Epworth sleepiness score: 12 /24.   BMI:  39.9 kg/m   Neck circumference:15.25    FINDINGS:   Total Recording Time (hours, min):   6 h 6 minutes      Total Sleep Time (hours, min):   5 h 35 m     with a technically valid sleep time of less than 3 hours /        Percent REM (%):  22%                                       Respiratory Indices:   Calculated pAHI (per AASM  guideline): 3.3/h                          REM pAHI:    7.5/h  NREM pAHI:    2.3/h                           Positional AHI:    supine AHI was 4.4/h    Snoring:  just above threshold of detection ( 40 dB ) and only present for 14 % of sleep time.                                               Oxygen Saturation Statistics:   Oxygen Saturation (%) Mean:   96     O2 Saturation Range (%):   89-98%                                    O2 Saturation (minutes) <89%:    0 minutes       Pulse Rate Statistics:   Pulse Mean (bpm):    69 bpm             Pulse Range:   Between  51 and 106 bpm            IMPRESSION:  This HST did not detect sleep apnea nor sleep hypoxia. There were more apneas/ hypopneas seen during supine  REM sleep, but even during these periods was there no clinically significant apnea present.    RECOMMENDATION: Sleep architecture showed low fragmentation and fairly uninterrupted sleep for 5 hours, with a  normal distribution of REM and NREM sleep .  No apnea of significance, no clinically significant snoring.  Avoiding supine sleep may help to reduce the few apneas further.  CPAP or dental device are not needed.  Please advise the patient to allocate enough hours to allow more sleep, at least 7 hours per 24 hours.      Any patient should be cautioned not to drive, work at heights, or operate dangerous or heavy equipment when tired or sleepy.    Review of good sleep hygiene measures is accessible to any sleep clinic patient and can be reiterated through online material- I we recommend the Guide to better Sleep   by the NIH.    Weight loss and Core Strength improvement is highly recommended for individuals with low muscle tone and/ or a BMI over 30.   Sleep fragmentation in the presence of normal proportional sleep stages is a nonspecific findings and per se does not signify an intrinsic sleep disorder or a cause for the patient's  sleep-related symptoms.  Causes include (but are not limited to) the unfamiliarity of sleeping while recorded by HST device or sleeping in a sleep lab for a full Polysomnography sleep study, but also circadian rhythm disturbances, medication side effects or an underlying mood disorder or medical problem.    The referring physician will be notified of the test results.          INTERPRETING PHYSICIAN:    Dedra Gores, MD  [facility] and [place] Sleep Board certified by The [employer] of Sleep Medicine and  Diplomate of the [employer]. Board certified In Neurology through the ABPN,  Fellow of the [employer].

## 2024-09-17 ENCOUNTER — Encounter

## 2024-09-18 NOTE — Telephone Encounter (Signed)
 Noted, thank you!

## 2024-09-19 ENCOUNTER — Other Ambulatory Visit: Payer: Self-pay | Admitting: *Deleted

## 2024-09-19 ENCOUNTER — Other Ambulatory Visit (HOSPITAL_COMMUNITY): Payer: Self-pay

## 2024-09-19 MED ORDER — MONTELUKAST SODIUM 10 MG PO TABS
ORAL_TABLET | ORAL | 0 refills | Status: AC
  Filled 2024-09-19: qty 4, 4d supply, fill #0

## 2024-09-19 MED ORDER — FAMOTIDINE 20 MG PO TABS
20.0000 mg | ORAL_TABLET | Freq: Every day | ORAL | 0 refills | Status: AC
  Filled 2024-09-19: qty 30, 30d supply, fill #0

## 2024-09-21 ENCOUNTER — Encounter: Payer: Self-pay | Admitting: Allergy

## 2024-09-21 ENCOUNTER — Ambulatory Visit: Admitting: Allergy

## 2024-09-21 VITALS — BP 118/82 | HR 84 | Resp 16

## 2024-09-21 DIAGNOSIS — H1013 Acute atopic conjunctivitis, bilateral: Secondary | ICD-10-CM

## 2024-09-21 DIAGNOSIS — J302 Other seasonal allergic rhinitis: Secondary | ICD-10-CM

## 2024-09-21 DIAGNOSIS — J3089 Other allergic rhinitis: Secondary | ICD-10-CM

## 2024-09-21 DIAGNOSIS — J4520 Mild intermittent asthma, uncomplicated: Secondary | ICD-10-CM | POA: Diagnosis not present

## 2024-09-21 NOTE — Progress Notes (Signed)
 RAPID DESENSITIZATION Note  RE: Jane Ellison MRN: [id number] DOB: 04-12-1991 Date of Office Visit: 09/21/2024  Subjective:  Patient presents today for rapid desensitization.  Interval History: Patient has not been ill, she has taken all premedications as per protocol.  Recent/Current History: Pulmonary disease: no Cardiac disease: no Respiratory infection: no Rash: no Itch: no Swelling: no Cough: no Shortness of breath: no Runny/stuffy nose: no Itchy eyes: no Beta-blocker use: no  Patient/guardian was informed of the procedure with verbalized understanding of the risk of anaphylaxis. Consent has been signed.   Medication List:  Current Outpatient Medications  Medication Sig Dispense Refill   acetaminophen  (TYLENOL ) 325 MG tablet Take 2 tablets (650 mg total) by mouth every 4 (four) hours as needed (for pain scale < 4). 100 tablet 0   acetaZOLAMIDE  ER (DIAMOX ) 500 MG capsule Take 1 capsule (500 mg total) by mouth 2 (two) times daily. 180 capsule 3   albuterol  (VENTOLIN  HFA) 108 (90 Base) MCG/ACT inhaler Inhale 2 puffs into the lungs every 6 (six) hours as needed for wheezing or shortness of breath. 6.7 g 2   benzonatate  (TESSALON ) 100 MG capsule Take 1 capsule (100 mg total) by mouth 2 (two) times daily as needed for cough. 20 capsule 0   betamethasone  dipropionate (DIPROLENE ) 0.05 % ointment Apply to scalp 3 times per week as needed and to body twice daily x 2 wk, then daily X 2 wk, then every other day x 2 wk. Not to face. Repeat as needed. 90 g 3   betamethasone  dipropionate (DIPROLENE ) 0.05 % ointment Apply topically as directed 90 g 3   clindamycin  (CLEOCIN  T) 1 % external solution Apply to affected area daily to twice daily (in the groin). 60 mL 4   EPINEPHrine  (EPIPEN  2-PAK) 0.3 mg/0.3 mL IJ SOAJ injection Inject 0.3 mg into the muscle as needed for anaphylaxis. 2 each 1   EPINEPHrine  (NEFFY ) 2 MG/0.1ML SOLN Place 2 sprays into both nostrils as needed  (ANAPHYLAXIS). 6 each 1   famotidine  (PEPCID ) 20 MG tablet Take 1 tablet (20 mg total) by mouth daily. 30 tablet 0   ibuprofen  (ADVIL ) 600 MG tablet Take 1 tablet (600 mg total) by mouth every 6 (six) hours. 30 tablet 0   ketotifen  (ZADITOR ) 0.035 % ophthalmic solution Place 1 drop into both eyes 2 (two) times daily as needed. 10 mL 5   loratadine (CLARITIN) 10 MG tablet Take 10 mg by mouth daily.     montelukast  (SINGULAIR ) 10 MG tablet Use as directed for RUSH protocol. 4 tablet 0   Multiple Vitamin (MULTIVITAMIN WITH MINERALS) TABS tablet Take 1 tablet by mouth daily.     NIFEdipine  (PROCARDIA  XL/NIFEDICAL XL) 60 MG 24 hr tablet Take 1 tablet (60 mg total) by mouth daily. 90 tablet 1   Olopatadine-Mometasone (RYALTRIS ) 665-25 MCG/ACT SUSP Place 2 sprays into the nose 2 (two) times daily as needed. 29 g 2   ondansetron  (ZOFRAN -ODT) 4 MG disintegrating tablet Take 1 tablet (4 mg total) by mouth every 8 (eight) hours as needed for nausea or vomiting. 20 tablet 0   Rimegepant Sulfate  (NURTEC) 75 MG TBDP Dissolve 1 tablet on the tongue as needed at first sign of migraine 15 tablet 2   Ruxolitinib  Phosphate (OPZELURA ) 1.5 % CREA Apply 1 Application topically 2 (two) times daily as needed. 60 g 3   triamcinolone cream (KENALOG) 0.1 % Apply 1 application. topically daily as needed (eczema).  6   No current facility-administered medications for  this visit.   Allergies: Allergies[1] I reviewed her past medical history, social history, family history, and environmental history and no significant changes have been reported from her previous visit.  ROS: Negative except as per HPI.  Objective: BP 112/80   Pulse 90   Resp 16   SpO2 98%  General Appearance:  Alert, cooperative, no distress, appears stated age  Head:  Normocephalic, without obvious abnormality, atraumatic  Eyes:  Conjunctiva clear, EOM's intact  Nose: Nares normal  Throat: Lips, tongue normal; teeth and gums normal, normal  posterior oropharnyx  Neck: Supple, symmetrical  Lungs:   CTAB, Respirations unlabored, no coughing  Heart:  Appears well perfused  Extremities: No edema  Skin: Skin color, texture, turgor normal, no rashes or lesions on visualized portions of skin  Neurologic: No gross deficits     Diagnostics:  Spirometry: FEV1 2.53L 98%, FVC 3.07L 100%  nonobstructive pattern  PROCEDURES:  Patient received the following doses every hour: Step 1:  0.1ml - 1:10,000 dilution (silver vial) Step 2:  0.3ml - 1:10,000  dilution (silver vial) Step 3: 0.1ml - 1:1,000 dilution (green vial)  Step 4: 0.3ml - 1:1,000 dilution (green vial)  Step 5: 0.80ml - 1:100 dilution (blue vial) Step 6: 0.2ml - 1:100 dilution (blue vial) Step 7: 0.5ml - 1:100 dilution (blue vial) Step 8: 0.47ml - 1:100 dilution (blue vial)  Patient was observed for 1 hour after the last dose.   Procedure started at 0830 Procedure ended at 1430   ASSESSMENT/PLAN:   Patient has tolerated the rapid desensitization protocol.  Next appointment: Start at 0.05ml of 1:10 dilution (yellow vial) and build up per protocol.  Danita Brain, MD [facility]      [1]  Allergies Allergen Reactions   Other Anaphylaxis, Other (See Comments) and Itching    Tree Nuts Only   Shellfish Allergy Anaphylaxis and Nausea And Vomiting   Pollen Extract Other (See Comments)    Runny nose - Seasonal Allergies   Iodinated Contrast Media Other (See Comments)   Iodine Other (See Comments)

## 2024-09-26 ENCOUNTER — Ambulatory Visit (INDEPENDENT_AMBULATORY_CARE_PROVIDER_SITE_OTHER)

## 2024-09-26 DIAGNOSIS — J3089 Other allergic rhinitis: Secondary | ICD-10-CM

## 2024-09-26 DIAGNOSIS — J302 Other seasonal allergic rhinitis: Secondary | ICD-10-CM | POA: Diagnosis not present

## 2024-10-09 ENCOUNTER — Other Ambulatory Visit: Payer: Self-pay

## 2024-10-09 ENCOUNTER — Encounter: Payer: Self-pay | Admitting: Family

## 2024-10-09 ENCOUNTER — Other Ambulatory Visit (HOSPITAL_COMMUNITY): Payer: Self-pay

## 2024-10-09 ENCOUNTER — Ambulatory Visit (INDEPENDENT_AMBULATORY_CARE_PROVIDER_SITE_OTHER): Admitting: Family

## 2024-10-09 VITALS — BP 112/78 | HR 92 | Temp 98.7°F

## 2024-10-09 DIAGNOSIS — J4521 Mild intermittent asthma with (acute) exacerbation: Secondary | ICD-10-CM | POA: Diagnosis not present

## 2024-10-09 DIAGNOSIS — J3489 Other specified disorders of nose and nasal sinuses: Secondary | ICD-10-CM

## 2024-10-09 MED ORDER — PREDNISONE 10 MG PO TABS
ORAL_TABLET | ORAL | 0 refills | Status: AC
  Filled 2024-10-09: qty 8, 4d supply, fill #0

## 2024-10-09 NOTE — Progress Notes (Signed)
 "  [address] [place] [address] Dept: [phone]  FOLLOW UP NOTE  Patient ID: Jane Ellison, female    DOB: [birth year]  Age: 33 y.o. MRN: [id number] Date of Office Visit: 10/09/2024  Assessment  Chief Complaint: Cough, Nasal Congestion, Breathing Problem, and Sinus Problem  HPI Jane Ellison is a 33 year old female who presents today for acute visit of nasal congestion, runny nose, and cough.  She was last seen on September 21, 2024 by Dr. Venora for rapid desensitization.  She denies any new diagnosis or surgery since her last office visit.  She reports that on Friday she had bodyaches.  On Saturday she woke up and could barely get out of bed because she was fatigued.  She was also coughing and had a low-grade fever that did not ever get above 100 F.  Her last fever was Saturday night.  She reports nasal congestion and rhinorrhea that is green now.The rhinorrhea has been green since Sunday.  Also, what she is coughing up is green and super thick.  She feels like she is having to use her albuterol  more often.  She is using it 3 times since Saturday, but probably should have been using it more.  She feels like her chest is hurting and she is having to catch her breath more. She has shortness of breath and because of the coughing it takes her a while to settle down.  She feels tightness across her chest.  She denies wheezing.  She took a COVID and flu test on Saturday and reports it is negative.  She alternates between Claritin and Zyrtec.  She has also been using Ryaltris , Afrin, and saline.  She does not know of any known sick contacts.   Drug Allergies:  Allergies[1]  Review of Systems: Negative except as per HPI   Physical Exam: BP 112/78   Pulse 92   Temp 98.7 F (37.1 C)   SpO2 98%    Physical Exam Constitutional:      Appearance: Normal appearance.  HENT:     Head: Normocephalic and atraumatic.     Comments: Pharynx normal, eyes normal, ears  normal, nose: Bilateral lower turbinates mildly edematous and slightly erythematous with clear drainage noted    Right Ear: Tympanic membrane, ear canal and external ear normal.     Left Ear: Tympanic membrane, ear canal and external ear normal.     Mouth/Throat:     Mouth: Mucous membranes are moist.     Pharynx: Oropharynx is clear.  Eyes:     Conjunctiva/sclera: Conjunctivae normal.  Cardiovascular:     Rate and Rhythm: Regular rhythm.     Heart sounds: Normal heart sounds.  Pulmonary:     Effort: Pulmonary effort is normal.     Breath sounds: Normal breath sounds.     Comments: Lungs clear to auscultation Musculoskeletal:     Cervical back: Neck supple.  Skin:    General: Skin is warm.  Neurological:     Mental Status: She is alert and oriented to person, place, and time.  Psychiatric:        Mood and Affect: Mood normal.        Behavior: Behavior normal.        Thought Content: Thought content normal.        Judgment: Judgment normal.     Diagnostics: FVC 3.07 L (100%), FEV1 2.47 L (95%), FEV1/FVC 0.80.  Spirometry indicates normal spirometry.  Assessment and Plan: 1. Stuffy and  runny nose   2. Mild intermittent asthma with (acute) exacerbation     Meds ordered this encounter  Medications   predniSONE  (DELTASONE ) 10 MG tablet    Sig: Starting tomorrow (10/10/24) take 2 tablets once a day for 4 days    Dispense:  8 tablet    Refill:  0    Patient Instructions  Asthma: not well controlled Prednisone  20 mg given in office today Starting tomorrow take Daily controller medication(s): NONE Prior to physical activity: May use albuterol  rescue inhaler 2 puffs 5 to 15 minutes prior to strenuous physical activities. Rescue medications: May use albuterol  rescue inhaler 2 puffs or nebulizer every 4 to 6 hours as needed for shortness of breath, chest tightness, coughing, and wheezing. Monitor frequency of use.  Let me know if you are not meeting the below goals Lung  function is normal today! Asthma control goals:  Full participation in all desired activities (may need albuterol  before activity) Albuterol  use two times or less a week on average (not counting use with activity) Cough interfering with sleep two times or less a month Oral steroids no more than once a year No hospitalizations  Allergic rhinitis: - Discussed that symptoms are likely viral with Covd-19 test and influenza testing being negative on Saturday - If your symptoms do not get better by Friday let me know and I will send in an antiboitic to treat for a sinus infection - Continue Afrin for now,but do not use longer than 3 days in a row Continue avoidance measures for dust mites, cat dander, dog dander, grass pollen, tree pollen, weed pollen, rodent, cockroach, mold.  Recommend use of Ryaltris  nasal spray 2 sprays each nostril twice a day at this time for congestion/drainage control Left nostril completely swollen leading to nasal obstruction.  Would use Afrin 2 sprays in nostril wait several minutes until can breathe more freely then use Ryaltris  as above. Do not use Afrin more than 3-5 days in a row.   With using nasal sprays point tip of bottle toward eye on same side nostril and lean head slightly forward for best technique.   Recommend use of Allegra 180 mg daily.   Use Zaditor  1 drop each eye twice daily as needed for itchy/watery eyes Consider completion of allergen immunotherapy for better symptom control.  Can start either traditional way or RUSH protocol way.    Food allergy: Continue avoidance of tree nuts and shellfish Serum IgE levels showed relatively low shellfish IgE levels,  moderate/high tree nut IgE levels and low IgE levels to Watermelon, pineapple, plum, peach (most likely OAS to fruits) Keep peanuts in the diet  You are eligible for in-office shellfish challenge if interested in seeing if you are no longer allergic.  You can schedule when/if you become ready to do  so.   Have access to epinephrine  (Epipen  0.3mg  or Neffy  2mg  nasal spray) at all times.   Follow emergency action plan in case of allergic reaction  The oral allergy syndrome (OAS) or pollen-food allergy syndrome (PFAS) is a relatively common form of food allergy, particularly in adults. It typically occurs in people who have pollen allergies when the immune system sees proteins on the food that look like proteins on the pollen. This results in the allergy antibody (IgE) binding to the food instead of the pollen. Patients typically report itching and/or mild swelling of the mouth and throat immediately following ingestion of certain uncooked fruits (including nuts) or raw vegetables. Only a very small  number of affected individuals experience systemic allergic reactions, such as anaphylaxis which occurs with true food allergies.      Eczema Continue daily moisturization especially after bathing Continue as needed use of the topical steroid creams (triamcinolone for mild-mod flares; clobetasol for severe flares) and/or non-steroids (ie Opzelura ) when needed  Follow up in 6-12 month or sooner if needed  Return in about 6 months (around 04/09/2025).    Thank you for the opportunity to care for this patient.  Please do not hesitate to contact me with questions.  Wanda Craze, FNP [facility]         [1]  Allergies Allergen Reactions   Other Anaphylaxis, Other (See Comments) and Itching    Tree Nuts Only   Shellfish Allergy Anaphylaxis and Nausea And Vomiting   Pollen Extract Other (See Comments)    Runny nose - Seasonal Allergies   Iodinated Contrast Media Other (See Comments)   Iodine Other (See Comments)   "

## 2024-10-09 NOTE — Patient Instructions (Addendum)
 Asthma: not well controlled Prednisone  20 mg given in office today Starting tomorrow take Daily controller medication(s): NONE Prior to physical activity: May use albuterol  rescue inhaler 2 puffs 5 to 15 minutes prior to strenuous physical activities. Rescue medications: May use albuterol  rescue inhaler 2 puffs or nebulizer every 4 to 6 hours as needed for shortness of breath, chest tightness, coughing, and wheezing. Monitor frequency of use.  Let me know if you are not meeting the below goals Lung function is normal today! Asthma control goals:  Full participation in all desired activities (may need albuterol  before activity) Albuterol  use two times or less a week on average (not counting use with activity) Cough interfering with sleep two times or less a month Oral steroids no more than once a year No hospitalizations  Allergic rhinitis: - Discussed that symptoms are likely viral with Covd-19 test and influenza testing being negative on Saturday - If your symptoms do not get better by Friday let me know and I will send in an antiboitic to treat for a sinus infection - Continue Afrin for now,but do not use longer than 3 days in a row Continue avoidance measures for dust mites, cat dander, dog dander, grass pollen, tree pollen, weed pollen, rodent, cockroach, mold.  Recommend use of Ryaltris  nasal spray 2 sprays each nostril twice a day at this time for congestion/drainage control Left nostril completely swollen leading to nasal obstruction.  Would use Afrin 2 sprays in nostril wait several minutes until can breathe more freely then use Ryaltris  as above. Do not use Afrin more than 3-5 days in a row.   With using nasal sprays point tip of bottle toward eye on same side nostril and lean head slightly forward for best technique.   Recommend use of Allegra 180 mg daily.   Use Zaditor  1 drop each eye twice daily as needed for itchy/watery eyes Consider completion of allergen immunotherapy for  better symptom control.  Can start either traditional way or [facility] protocol way.    Food allergy: Continue avoidance of tree nuts and shellfish Serum IgE levels showed relatively low shellfish IgE levels,  moderate/high tree nut IgE levels and low IgE levels to Watermelon, pineapple, plum, peach (most likely OAS to fruits) Keep peanuts in the diet  You are eligible for in-office shellfish challenge if interested in seeing if you are no longer allergic.  You can schedule when/if you become ready to do so.   Have access to epinephrine  (Epipen  0.3mg  or Neffy  2mg  nasal spray) at all times.   Follow emergency action plan in case of allergic reaction  The oral allergy syndrome (OAS) or pollen-food allergy syndrome (PFAS) is a relatively common form of food allergy, particularly in adults. It typically occurs in people who have pollen allergies when the immune system sees proteins on the food that look like proteins on the pollen. This results in the allergy antibody (IgE) binding to the food instead of the pollen. Patients typically report itching and/or mild swelling of the mouth and throat immediately following ingestion of certain uncooked fruits (including nuts) or raw vegetables. Only a very small number of affected individuals experience systemic allergic reactions, such as anaphylaxis which occurs with true food allergies.      Eczema Continue daily moisturization especially after bathing Continue as needed use of the topical steroid creams (triamcinolone for mild-mod flares; clobetasol for severe flares) and/or non-steroids (ie Opzelura ) when needed  Follow up in 6-12 month or sooner if needed

## 2024-10-12 ENCOUNTER — Ambulatory Visit

## 2024-10-12 DIAGNOSIS — J302 Other seasonal allergic rhinitis: Secondary | ICD-10-CM | POA: Diagnosis not present

## 2024-10-15 ENCOUNTER — Ambulatory Visit (INDEPENDENT_AMBULATORY_CARE_PROVIDER_SITE_OTHER)

## 2024-10-15 DIAGNOSIS — J3089 Other allergic rhinitis: Secondary | ICD-10-CM | POA: Diagnosis not present

## 2024-10-15 DIAGNOSIS — J302 Other seasonal allergic rhinitis: Secondary | ICD-10-CM

## 2024-10-17 ENCOUNTER — Ambulatory Visit

## 2024-10-17 DIAGNOSIS — J302 Other seasonal allergic rhinitis: Secondary | ICD-10-CM | POA: Diagnosis not present

## 2024-10-22 ENCOUNTER — Ambulatory Visit

## 2024-10-22 DIAGNOSIS — J302 Other seasonal allergic rhinitis: Secondary | ICD-10-CM | POA: Diagnosis not present

## 2024-10-24 ENCOUNTER — Ambulatory Visit (INDEPENDENT_AMBULATORY_CARE_PROVIDER_SITE_OTHER)

## 2024-10-24 DIAGNOSIS — J302 Other seasonal allergic rhinitis: Secondary | ICD-10-CM | POA: Diagnosis not present

## 2024-10-24 DIAGNOSIS — J3089 Other allergic rhinitis: Secondary | ICD-10-CM

## 2024-10-26 ENCOUNTER — Other Ambulatory Visit (HOSPITAL_COMMUNITY)

## 2024-10-29 ENCOUNTER — Ambulatory Visit

## 2024-10-29 DIAGNOSIS — J302 Other seasonal allergic rhinitis: Secondary | ICD-10-CM

## 2024-11-01 ENCOUNTER — Ambulatory Visit

## 2024-11-01 DIAGNOSIS — J302 Other seasonal allergic rhinitis: Secondary | ICD-10-CM

## 2024-11-07 ENCOUNTER — Ambulatory Visit

## 2024-11-07 DIAGNOSIS — J302 Other seasonal allergic rhinitis: Secondary | ICD-10-CM

## 2024-11-12 ENCOUNTER — Other Ambulatory Visit (HOSPITAL_COMMUNITY): Payer: Self-pay

## 2024-11-12 MED ORDER — NIFEDIPINE ER OSMOTIC RELEASE 60 MG PO TB24
60.0000 mg | ORAL_TABLET | Freq: Every day | ORAL | 0 refills | Status: AC | PRN
  Filled 2024-11-12: qty 90, 90d supply, fill #0

## 2024-11-14 ENCOUNTER — Ambulatory Visit

## 2024-11-14 DIAGNOSIS — J302 Other seasonal allergic rhinitis: Secondary | ICD-10-CM

## 2025-02-28 ENCOUNTER — Ambulatory Visit: Admitting: Adult Health

## 2025-05-17 ENCOUNTER — Ambulatory Visit: Admitting: Allergy
